# Patient Record
Sex: Male | Born: 1975
Health system: Southern US, Community
[De-identification: ages and names within clinical notes are randomized; demographics above are authoritative.]

## PROBLEM LIST (undated history)

## (undated) DIAGNOSIS — E119 Type 2 diabetes mellitus without complications: Secondary | ICD-10-CM

---

## 2010-03-28 ENCOUNTER — Ambulatory Visit: Payer: Self-pay | Admitting: Family Medicine

## 2014-06-21 ENCOUNTER — Emergency Department: Payer: Self-pay | Admitting: Emergency Medicine

## 2014-09-30 LAB — HM DIABETES EYE EXAM

## 2015-01-06 ENCOUNTER — Other Ambulatory Visit: Payer: Self-pay | Admitting: Family Medicine

## 2015-04-10 ENCOUNTER — Other Ambulatory Visit: Payer: Self-pay | Admitting: Family Medicine

## 2015-04-12 NOTE — Telephone Encounter (Signed)
Patient was last seen in March by Dr. Jeananne Rama; he was supposed to return 3 months later for follow-up Please let Aaron Wood know that we'd like to see patient for an appointment here in the office for:  Diabetes, high cholesterol Please schedule a visit with Dr. Jeananne Rama in the next: few weeks Fasting?  Yes please Thank you, Dr. Lovie Macadamia approve one week to get him through until Dr. Jeananne Rama return to provide further refills; he may want labs before more medicine is approved

## 2015-05-03 ENCOUNTER — Encounter: Payer: Self-pay | Admitting: Family Medicine

## 2015-05-26 ENCOUNTER — Other Ambulatory Visit: Payer: Self-pay | Admitting: Family Medicine

## 2015-05-26 NOTE — Telephone Encounter (Signed)
apt

## 2015-05-26 NOTE — Telephone Encounter (Signed)
Pt has been called twice and a letter was sent 05/03/15 for pt to make appt.

## 2016-01-23 DIAGNOSIS — Z23 Encounter for immunization: Secondary | ICD-10-CM | POA: Diagnosis not present

## 2016-08-07 IMAGING — CT CT ABD-PELV W/O CM
1 of 4 series · 5 of 46 positions shown, 10 images · non-contrast
Comparison: Report from 11/29/2003

CLINICAL DATA: Left flank and left lower quadrant abdominal pain.
Diaphoresis. History of renal calculi. Vomiting.

EXAM:
CT ABDOMEN AND PELVIS WITHOUT CONTRAST
TECHNIQUE: Multidetector CT imaging of the abdomen and pelvis was performed
following the standard protocol without IV contrast.

[Series 4: lung windows · axial · 0.74mm/px · z∈[-128,-28]mm · 5 of 30 slices shown, 10 images]
[im 5/30  soft-tissue]
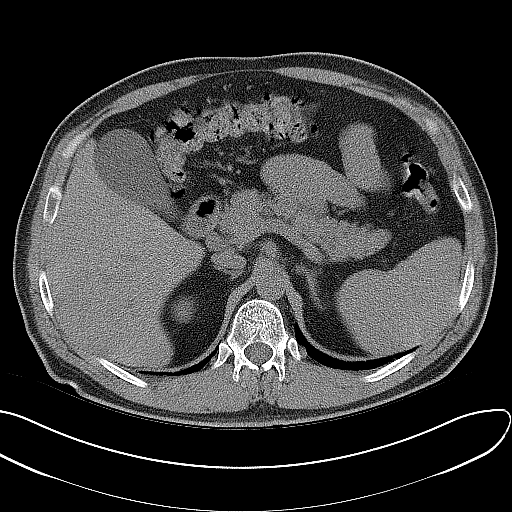
[im 5/30  bone]
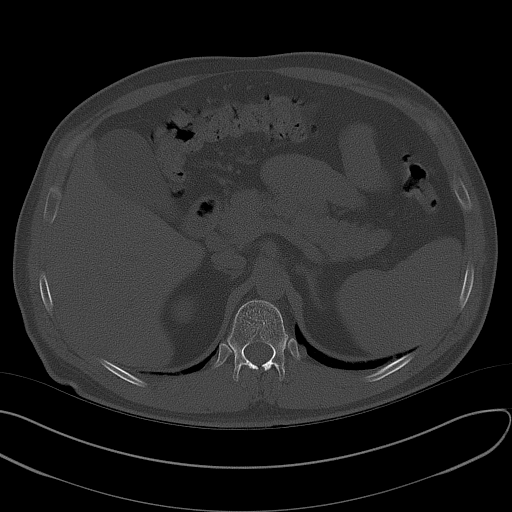
[im 10/30  soft-tissue]
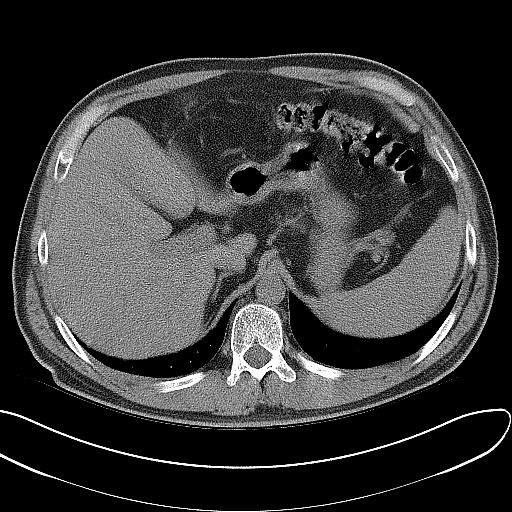
[im 10/30  lung]
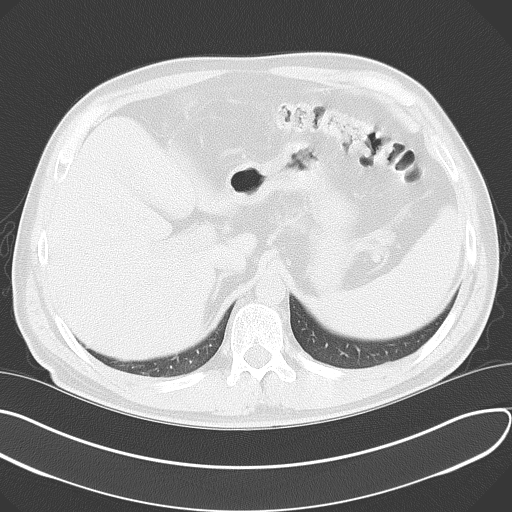
[im 15/30  soft-tissue]
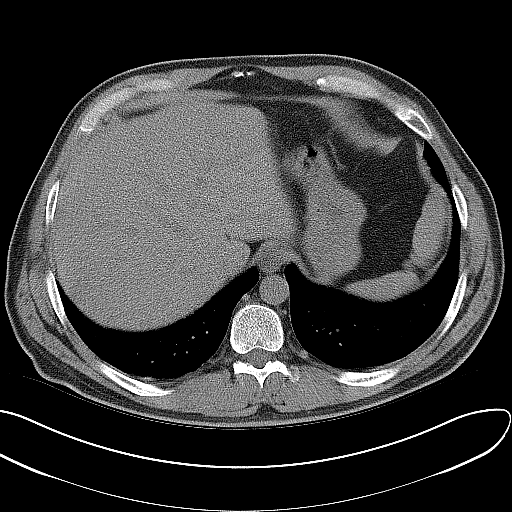
[im 15/30  lung]
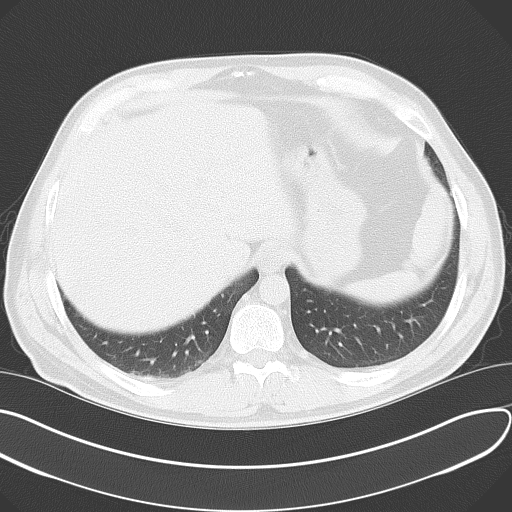
[im 20/30  soft-tissue]
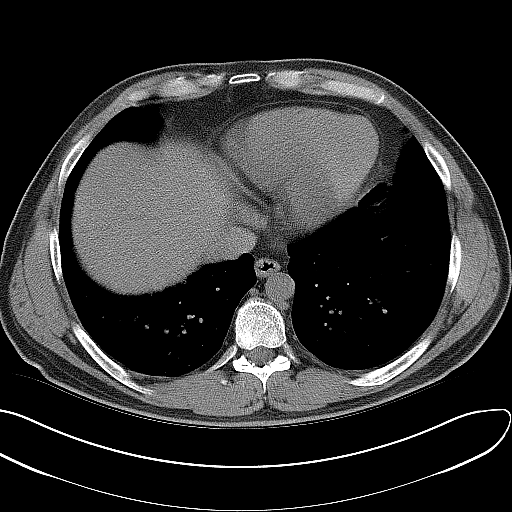
[im 20/30  lung]
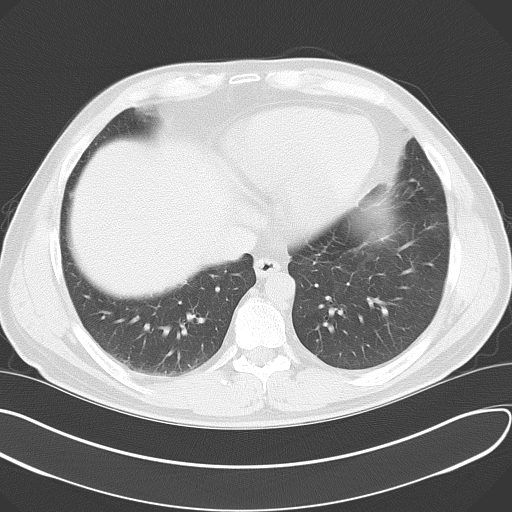
[im 25/30  soft-tissue]
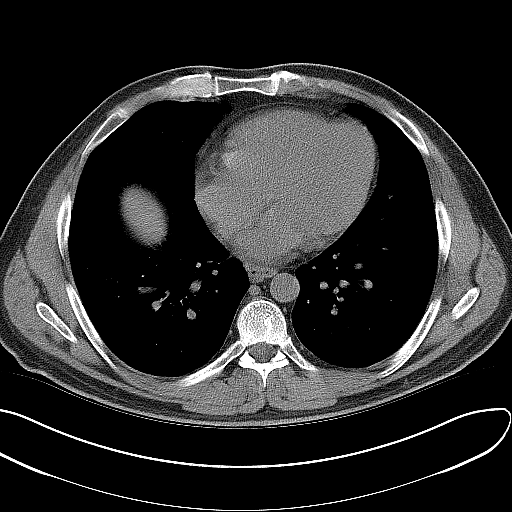
[im 25/30  lung]
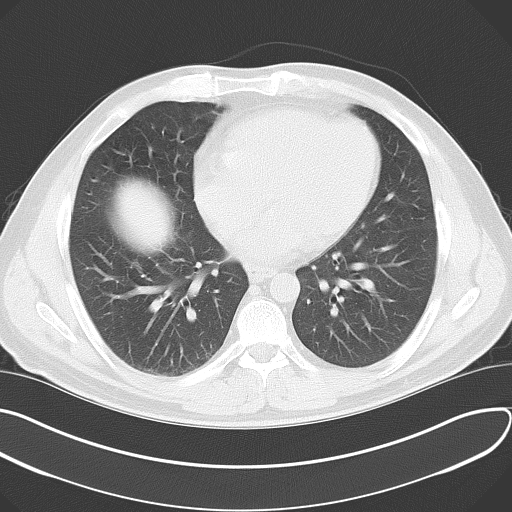

[5 of 46 positions shown; findings below may reference images not displayed]

FINDINGS: Lower chest:  Borderline cardiomegaly.

Hepatobiliary: 1.6 by 0.7 cm hypodense lesion posteriorly in segment
4A of the liver, image 20 series 2.

Pancreas: Unremarkable

Spleen: Unremarkable

Adrenals/Urinary Tract: Asymmetric left perirenal stranding with
asymmetric fullness of the left collecting system and mild left
hydroureter extending down to a 2 mm left UVJ calculus, shown on
image 78 series 5. There is also a 1-2 mm punctate nonobstructive
left mid kidney calculus. Faintly accentuated density along the
renal hypo wall without overt calcific density in this vicinity.

Stomach/Bowel: Unremarkable

Vascular/Lymphatic: Unremarkable

Reproductive: Unremarkable

Other: No supplemental non-categorized findings.

Musculoskeletal: Unremarkable
IMPRESSION: 1. Left UVJ 2 mm calculus associated with mild left hydroureter,
asymmetric left perirenal stranding, and fullness left collecting
system without overt hydronephrosis. There is also a punctate 1-2 mm
nonobstructive left mid kidney calculus.
2. 1.6 by 0.7 cm hypodense lesion posteriorly in segment 4 of the
liver, technically nonspecific although statistically likely to be a
benign lesion such as cyst or hemangioma.
3. Borderline cardiomegaly.

## 2017-05-15 ENCOUNTER — Ambulatory Visit: Payer: Self-pay | Admitting: Family Medicine

## 2017-07-11 ENCOUNTER — Encounter: Payer: Self-pay | Admitting: Family Medicine

## 2017-07-11 ENCOUNTER — Ambulatory Visit (INDEPENDENT_AMBULATORY_CARE_PROVIDER_SITE_OTHER): Payer: BLUE CROSS/BLUE SHIELD | Admitting: Family Medicine

## 2017-07-11 DIAGNOSIS — E785 Hyperlipidemia, unspecified: Secondary | ICD-10-CM

## 2017-07-11 DIAGNOSIS — E119 Type 2 diabetes mellitus without complications: Secondary | ICD-10-CM | POA: Diagnosis not present

## 2017-07-11 DIAGNOSIS — G4733 Obstructive sleep apnea (adult) (pediatric): Secondary | ICD-10-CM

## 2017-07-11 DIAGNOSIS — E1169 Type 2 diabetes mellitus with other specified complication: Secondary | ICD-10-CM | POA: Insufficient documentation

## 2017-07-11 LAB — LP+ALT+AST PICCOLO, WAIVED
ALT (SGPT) PICCOLO, WAIVED: 17 U/L (ref 10–47)
AST (SGOT) Piccolo, Waived: 18 U/L (ref 11–38)
CHOLESTEROL PICCOLO, WAIVED: 222 mg/dL — AB (ref <200)
Chol/HDL Ratio Piccolo,Waive: 4.7 mg/dL
HDL CHOL PICCOLO, WAIVED: 47 mg/dL — AB (ref >59)
LDL CHOL CALC PICCOLO WAIVED: 155 mg/dL — AB (ref <100)
Triglycerides Piccolo,Waived: 97 mg/dL (ref <150)
VLDL Chol Calc Piccolo,Waive: 19 mg/dL (ref <30)

## 2017-07-11 LAB — BAYER DCA HB A1C WAIVED: HB A1C: 12.9 % — AB (ref <7.0)

## 2017-07-11 MED ORDER — METFORMIN HCL 500 MG PO TABS: 1000.0000 mg | ORAL_TABLET | Freq: Two times a day (BID) | ORAL | 1 refills | Status: DC

## 2017-07-11 MED ORDER — ATORVASTATIN CALCIUM 20 MG PO TABS: 20.0000 mg | ORAL_TABLET | Freq: Every day | ORAL | 1 refills | Status: DC

## 2017-07-11 NOTE — Assessment & Plan Note (Signed)
Discussed diabetes poor control patient ready to get started again will restart metformin increasing 500 mg a week until thousand twice daily.

## 2017-07-11 NOTE — Assessment & Plan Note (Signed)
Also ready to get started with care on cholesterol will restart atorvastatin 20 mg

## 2017-07-11 NOTE — Progress Notes (Signed)
   BP 133/85   Pulse 68   Ht 5' 7"  (1.702 m)   Wt 191 lb (86.6 kg)   SpO2 98%   BMI 29.91 kg/m    Subjective:    Patient ID: Aaron Wood, male    DOB: 11-04-1975, 42 y.o.   MRN: [id number]  HPI: Aaron Wood is a 42 y.o. male  Chief Complaint  Patient presents with  . Follow-up  . Insomnia   Patient with observed sleep apnea gasping and choking with other sleep apnea symptoms see sleep scale an Epworth score of 18. This is been ongoing for several years and getting worse.  Patient's wife ready for him to do something.  Also is ready and motivated to get back on medications. When taking medications had no problems.  Relevant past medical, surgical, family and social history reviewed and updated as indicated. Interim medical history since our last visit reviewed. Allergies and medications reviewed and updated.  Review of Systems  Constitutional: Negative.   Respiratory: Negative.   Cardiovascular: Negative.     Per HPI unless specifically indicated above     Objective:    BP 133/85   Pulse 68   Ht 5' 7"  (1.702 m)   Wt 191 lb (86.6 kg)   SpO2 98%   BMI 29.91 kg/m   Wt Readings from Last 3 Encounters:  07/11/17 191 lb (86.6 kg)    Physical Exam  No results found for this or any previous visit.    Assessment & Plan:   Problem List Items Addressed This Visit      Respiratory   OSA (obstructive sleep apnea)    Sleep apnea symptoms refer for sleep study      Relevant Orders   Ambulatory referral to Sleep Studies     Endocrine   DM2 (diabetes mellitus, type 2) ([place])    Discussed diabetes poor control patient ready to get started again will restart metformin increasing 500 mg a week until thousand twice daily.      Relevant Medications   atorvastatin (LIPITOR) 20 MG tablet   metFORMIN (GLUCOPHAGE) 500 MG tablet   Other Relevant Orders   Basic metabolic panel   Bayer DCA Hb A1c Waived     Other   Hyperlipemia    Also ready to get started with  care on cholesterol will restart atorvastatin 20 mg      Relevant Medications   atorvastatin (LIPITOR) 20 MG tablet   Other Relevant Orders   LP+ALT+AST Piccolo, Waived       Follow up plan: Return in about 3 months (around 10/11/2017) for Physical Exam, Hemoglobin A1c.

## 2017-07-11 NOTE — Assessment & Plan Note (Signed)
Sleep apnea symptoms refer for sleep study

## 2017-07-12 ENCOUNTER — Telehealth: Payer: Self-pay | Admitting: Family Medicine

## 2017-07-12 ENCOUNTER — Encounter: Payer: Self-pay | Admitting: Family Medicine

## 2017-07-12 LAB — BASIC METABOLIC PANEL
BUN / CREAT RATIO: 12 (ref 9–20)
BUN: 11 mg/dL (ref 6–24)
CALCIUM: 10.2 mg/dL (ref 8.7–10.2)
CHLORIDE: 100 mmol/L (ref 96–106)
CO2: 24 mmol/L (ref 20–29)
CREATININE: 0.93 mg/dL (ref 0.76–1.27)
GFR calc Af Amer: 116 mL/min/{1.73_m2} (ref >59)
GFR calc non Af Amer: 100 mL/min/{1.73_m2} (ref >59)
Glucose: 206 mg/dL — ABNORMAL HIGH (ref 65–99)
Potassium: 4.7 mmol/L (ref 3.5–5.2)
Sodium: 138 mmol/L (ref 134–144)

## 2017-07-12 NOTE — Telephone Encounter (Unsigned)
Copied from [place] [id number]. Topic: Quick Communication - See Telephone Encounter >> Jul 12, 2017 11:40 AM Neva Seat wrote: [place] [phone]  Needing office visit notes on pt to be faxed over asap. [phone] - fax

## 2017-07-30 ENCOUNTER — Encounter: Payer: Self-pay | Admitting: Family Medicine

## 2017-07-30 DIAGNOSIS — G4733 Obstructive sleep apnea (adult) (pediatric): Secondary | ICD-10-CM | POA: Diagnosis not present

## 2017-08-13 ENCOUNTER — Telehealth: Payer: Self-pay | Admitting: Family Medicine

## 2017-08-13 NOTE — Telephone Encounter (Signed)
Please see prior message from March. Never received that encounter. Notes were faxed today. Patient is aware.

## 2017-08-13 NOTE — Telephone Encounter (Signed)
Copied from [place] [id number]. Topic: Quick Communication - See Telephone Encounter >> Aug 13, 2017  3:03 PM Boyd Kerbs wrote: CRM for notification.   Pt. Called saying Dr. Jeananne Rama had called and told him he would need a machine and he has not heard anything back. Asking what he needs to do   See Telephone encounter for: 08/13/17.

## 2017-08-22 DIAGNOSIS — G4733 Obstructive sleep apnea (adult) (pediatric): Secondary | ICD-10-CM | POA: Diagnosis not present

## 2017-09-22 DIAGNOSIS — G4733 Obstructive sleep apnea (adult) (pediatric): Secondary | ICD-10-CM | POA: Diagnosis not present

## 2017-10-15 ENCOUNTER — Encounter: Payer: Self-pay | Admitting: Family Medicine

## 2017-10-15 ENCOUNTER — Ambulatory Visit (INDEPENDENT_AMBULATORY_CARE_PROVIDER_SITE_OTHER): Payer: BLUE CROSS/BLUE SHIELD | Admitting: Family Medicine

## 2017-10-15 VITALS — BP 129/80 | HR 84 | Ht 66.14 in | Wt 183.0 lb

## 2017-10-15 DIAGNOSIS — Z13220 Encounter for screening for lipoid disorders: Secondary | ICD-10-CM

## 2017-10-15 DIAGNOSIS — Z1329 Encounter for screening for other suspected endocrine disorder: Secondary | ICD-10-CM | POA: Diagnosis not present

## 2017-10-15 DIAGNOSIS — Z23 Encounter for immunization: Secondary | ICD-10-CM

## 2017-10-15 DIAGNOSIS — Z125 Encounter for screening for malignant neoplasm of prostate: Secondary | ICD-10-CM

## 2017-10-15 DIAGNOSIS — Z0001 Encounter for general adult medical examination with abnormal findings: Secondary | ICD-10-CM

## 2017-10-15 DIAGNOSIS — G4733 Obstructive sleep apnea (adult) (pediatric): Secondary | ICD-10-CM

## 2017-10-15 DIAGNOSIS — E119 Type 2 diabetes mellitus without complications: Secondary | ICD-10-CM

## 2017-10-15 DIAGNOSIS — E785 Hyperlipidemia, unspecified: Secondary | ICD-10-CM | POA: Diagnosis not present

## 2017-10-15 DIAGNOSIS — Z0000 Encounter for general adult medical examination without abnormal findings: Secondary | ICD-10-CM

## 2017-10-15 DIAGNOSIS — Z114 Encounter for screening for human immunodeficiency virus [HIV]: Secondary | ICD-10-CM | POA: Diagnosis not present

## 2017-10-15 LAB — URINALYSIS, ROUTINE W REFLEX MICROSCOPIC
Bilirubin, UA: NEGATIVE
Leukocytes, UA: NEGATIVE
NITRITE UA: NEGATIVE
PH UA: 5 (ref 5.0–7.5)
RBC, UA: NEGATIVE
Specific Gravity, UA: >1.03 — ABNORMAL HIGH (ref 1.005–1.030)
UUROB: 0.2 mg/dL (ref 0.2–1.0)

## 2017-10-15 LAB — MICROALBUMIN, URINE WAIVED
CREATININE, URINE WAIVED: 300 mg/dL (ref 10–300)
MICROALB, UR WAIVED: 80 mg/L — AB (ref 0–19)

## 2017-10-15 LAB — MICROSCOPIC EXAMINATION: Bacteria, UA: NONE SEEN

## 2017-10-15 LAB — BAYER DCA HB A1C WAIVED: HB A1C: 9.8 % — AB (ref <7.0)

## 2017-10-15 MED ORDER — METFORMIN HCL 500 MG PO TABS: 1000.0000 mg | ORAL_TABLET | Freq: Two times a day (BID) | ORAL | 4 refills | Status: DC

## 2017-10-15 MED ORDER — ATORVASTATIN CALCIUM 20 MG PO TABS: 20.0000 mg | ORAL_TABLET | Freq: Every day | ORAL | 4 refills | Status: DC

## 2017-10-15 MED ORDER — DAPAGLIFLOZIN PROPANEDIOL 10 MG PO TABS: 10.0000 mg | ORAL_TABLET | Freq: Every day | ORAL | 3 refills | Status: DC

## 2017-10-15 NOTE — Assessment & Plan Note (Signed)
Discussed diabetes poor control patient will continue metformin add Wilder Glade gave coupon

## 2017-10-15 NOTE — Assessment & Plan Note (Signed)
The current medical regimen is effective;  continue present plan and medications.  

## 2017-10-15 NOTE — Progress Notes (Signed)
BP 129/80   Pulse 84   Ht 5' 6.14" (1.68 m)   Wt 183 lb (83 kg)   SpO2 98%   BMI 29.41 kg/m    Subjective:    Patient ID: Aaron Wood, male    DOB: [birth year], 42 y.o.   MRN: [id number]  HPI: Aaron Wood is a 42 y.o. male  Annual exam All in all doing well taking metformin faithfully blood sugars though consistently around 200 or so in the morning when waking up. Also taking atorvastatin without problems.  Relevant past medical, surgical, family and social history reviewed and updated as indicated. Interim medical history since our last visit reviewed. Allergies and medications reviewed and updated.  Review of Systems  Constitutional: Negative.   HENT: Negative.   Eyes: Negative.   Respiratory: Negative.   Cardiovascular: Negative.   Gastrointestinal: Negative.   Endocrine: Negative.   Genitourinary: Negative.   Musculoskeletal: Negative.   Skin: Negative.   Allergic/Immunologic: Negative.   Neurological: Negative.   Hematological: Negative.   Psychiatric/Behavioral: Negative.     Per HPI unless specifically indicated above     Objective:    BP 129/80   Pulse 84   Ht 5' 6.14" (1.68 m)   Wt 183 lb (83 kg)   SpO2 98%   BMI 29.41 kg/m   Wt Readings from Last 3 Encounters:  10/15/17 183 lb (83 kg)  07/11/17 191 lb (86.6 kg)    Physical Exam  Constitutional: He is oriented to person, place, and time. He appears well-developed and well-nourished.  HENT:  Head: Normocephalic and atraumatic.  Right Ear: External ear normal.  Left Ear: External ear normal.  Eyes: Pupils are equal, round, and reactive to light. Conjunctivae and EOM are normal.  Neck: Normal range of motion. Neck supple.  Cardiovascular: Normal rate, regular rhythm, normal heart sounds and intact distal pulses.  Pulmonary/Chest: Effort normal and breath sounds normal.  Abdominal: Soft. Bowel sounds are normal. There is no splenomegaly or hepatomegaly.  Genitourinary: Rectum normal,  prostate normal and penis normal.  Musculoskeletal: Normal range of motion.  Neurological: He is alert and oriented to person, place, and time. He has normal reflexes.  Skin: No rash noted. No erythema.  Psychiatric: He has a normal mood and affect. His behavior is normal. Judgment and thought content normal.    Results for orders placed or performed in visit on 02/58/52  Basic metabolic panel  Result Value Ref Range   Glucose 206 (H) 65 - 99 mg/dL   BUN 11 6 - 24 mg/dL   Creatinine, Ser 0.93 0.76 - 1.27 mg/dL   GFR calc non Af Amer 100 >59 mL/min/1.73   GFR calc Af Amer 116 >59 mL/min/1.73   BUN/Creatinine Ratio 12 9 - 20   Sodium 138 134 - 144 mmol/L   Potassium 4.7 3.5 - 5.2 mmol/L   Chloride 100 96 - 106 mmol/L   CO2 24 20 - 29 mmol/L   Calcium 10.2 8.7 - 10.2 mg/dL  Bayer DCA Hb A1c Waived  Result Value Ref Range   HB A1C (BAYER DCA - WAIVED) 12.9 (H) <7.0 %  LP+ALT+AST Piccolo, Waived  Result Value Ref Range   ALT (SGPT) Piccolo, Waived 17 10 - 47 U/L   AST (SGOT) Piccolo, Waived 18 11 - 38 U/L   Cholesterol Piccolo, Waived 222 (H) <200 mg/dL   HDL Chol Piccolo, Waived 47 (L) >59 mg/dL   Triglycerides Piccolo,Waived 97 <150 mg/dL   Chol/HDL Ratio  Piccolo,Waive 4.7 mg/dL   LDL Chol Calc Piccolo Waived 155 (H) <100 mg/dL   VLDL Chol Calc Piccolo,Waive 19 <30 mg/dL      Assessment & Plan:   Problem List Items Addressed This Visit      Respiratory   OSA (obstructive sleep apnea)    The current medical regimen is effective;  continue present plan and medications.         Endocrine   DM2 (diabetes mellitus, type 2) ([place]) - Primary    Discussed diabetes poor control patient will continue metformin add Wilder Glade gave coupon      Relevant Medications   atorvastatin (LIPITOR) 20 MG tablet   metFORMIN (GLUCOPHAGE) 500 MG tablet   dapagliflozin propanediol (FARXIGA) 10 MG TABS tablet   Other Relevant Orders   CBC with Differential/Platelet   Comprehensive metabolic  panel   Lipid panel   Urinalysis, Routine w reflex microscopic   Bayer DCA Hb A1c Waived   Microalbumin, Urine Waived     Other   Hyperlipemia    The current medical regimen is effective;  continue present plan and medications.       Relevant Medications   atorvastatin (LIPITOR) 20 MG tablet   Other Relevant Orders   CBC with Differential/Platelet   Comprehensive metabolic panel   Lipid panel   Urinalysis, Routine w reflex microscopic   Bayer DCA Hb A1c Waived   Microalbumin, Urine Waived    Other Visit Diagnoses    Annual physical exam       Need for Tdap vaccination       Encounter for screening for HIV       Relevant Orders   HIV antibody (with reflex)   Screening cholesterol level       Prostate cancer screening       Relevant Orders   PSA   Thyroid disorder screen       Relevant Orders   TSH       Follow up plan: Return in about 3 months (around 01/15/2018) for Hemoglobin A1c.

## 2017-10-16 ENCOUNTER — Encounter: Payer: Self-pay | Admitting: Family Medicine

## 2017-10-16 LAB — CBC WITH DIFFERENTIAL/PLATELET
BASOS ABS: 0 10*3/uL (ref 0.0–0.2)
Basos: 0 %
EOS (ABSOLUTE): 0.2 10*3/uL (ref 0.0–0.4)
Eos: 3 %
Hematocrit: 42.7 % (ref 37.5–51.0)
Hemoglobin: 14.9 g/dL (ref 13.0–17.7)
IMMATURE GRANS (ABS): 0 10*3/uL (ref 0.0–0.1)
Immature Granulocytes: 0 %
LYMPHS: 22 %
Lymphocytes Absolute: 1.7 10*3/uL (ref 0.7–3.1)
MCH: 30 pg (ref 26.6–33.0)
MCHC: 34.9 g/dL (ref 31.5–35.7)
MCV: 86 fL (ref 79–97)
Monocytes Absolute: 0.5 10*3/uL (ref 0.1–0.9)
Monocytes: 7 %
NEUTROS ABS: 5.1 10*3/uL (ref 1.4–7.0)
Neutrophils: 68 %
PLATELETS: 275 10*3/uL (ref 150–450)
RBC: 4.96 x10E6/uL (ref 4.14–5.80)
RDW: 13.7 % (ref 12.3–15.4)
WBC: 7.5 10*3/uL (ref 3.4–10.8)

## 2017-10-16 LAB — COMPREHENSIVE METABOLIC PANEL
ALK PHOS: 61 IU/L (ref 39–117)
ALT: 11 IU/L (ref 0–44)
AST: 11 IU/L (ref 0–40)
Albumin/Globulin Ratio: 2.3 — ABNORMAL HIGH (ref 1.2–2.2)
Albumin: 5 g/dL (ref 3.5–5.5)
BILIRUBIN TOTAL: 0.4 mg/dL (ref 0.0–1.2)
BUN/Creatinine Ratio: 14 (ref 9–20)
BUN: 15 mg/dL (ref 6–24)
CHLORIDE: 101 mmol/L (ref 96–106)
CO2: 21 mmol/L (ref 20–29)
Calcium: 10.1 mg/dL (ref 8.7–10.2)
Creatinine, Ser: 1.05 mg/dL (ref 0.76–1.27)
GFR calc non Af Amer: 87 mL/min/{1.73_m2} (ref >59)
GFR, EST AFRICAN AMERICAN: 100 mL/min/{1.73_m2} (ref >59)
GLUCOSE: 162 mg/dL — AB (ref 65–99)
Globulin, Total: 2.2 g/dL (ref 1.5–4.5)
Potassium: 4.4 mmol/L (ref 3.5–5.2)
Sodium: 140 mmol/L (ref 134–144)
TOTAL PROTEIN: 7.2 g/dL (ref 6.0–8.5)

## 2017-10-16 LAB — HIV ANTIBODY (ROUTINE TESTING W REFLEX): HIV Screen 4th Generation wRfx: NONREACTIVE

## 2017-10-16 LAB — LIPID PANEL
CHOLESTEROL TOTAL: 146 mg/dL (ref 100–199)
Chol/HDL Ratio: 3.7 ratio (ref 0.0–5.0)
HDL: 40 mg/dL (ref >39)
LDL Calculated: 88 mg/dL (ref 0–99)
TRIGLYCERIDES: 92 mg/dL (ref 0–149)
VLDL CHOLESTEROL CAL: 18 mg/dL (ref 5–40)

## 2017-10-16 LAB — TSH: TSH: 1.32 u[IU]/mL (ref 0.450–4.500)

## 2017-10-16 LAB — PSA: Prostate Specific Ag, Serum: 0.6 ng/mL (ref 0.0–4.0)

## 2017-10-22 DIAGNOSIS — G4733 Obstructive sleep apnea (adult) (pediatric): Secondary | ICD-10-CM | POA: Diagnosis not present

## 2017-11-22 DIAGNOSIS — G4733 Obstructive sleep apnea (adult) (pediatric): Secondary | ICD-10-CM | POA: Diagnosis not present

## 2017-12-23 DIAGNOSIS — G4733 Obstructive sleep apnea (adult) (pediatric): Secondary | ICD-10-CM | POA: Diagnosis not present

## 2018-01-22 DIAGNOSIS — G4733 Obstructive sleep apnea (adult) (pediatric): Secondary | ICD-10-CM | POA: Diagnosis not present

## 2018-01-28 DIAGNOSIS — Z23 Encounter for immunization: Secondary | ICD-10-CM | POA: Diagnosis not present

## 2018-02-03 ENCOUNTER — Ambulatory Visit: Payer: BLUE CROSS/BLUE SHIELD | Admitting: Family Medicine

## 2018-02-05 ENCOUNTER — Other Ambulatory Visit: Payer: Self-pay | Admitting: Family Medicine

## 2018-02-05 NOTE — Telephone Encounter (Signed)
Requested Prescriptions  Pending Prescriptions Disp Refills  . FARXIGA 10 MG TABS tablet [Pharmacy Med Name: FARXIGA 10 MG TABLET] 90 tablet 0    Sig: TAKE 1 TABLET BY MOUTH EVERY DAY     Endocrinology:  Diabetes - SGLT2 Inhibitors Failed - 02/05/2018  1:47 AM      Failed - HBA1C is between 0 and 7.9 and within 180 days    No results found for: HGBA1C       Passed - Cr in normal range and within 360 days    Creatinine, Ser  Date Value Ref Range Status  10/15/2017 1.05 0.76 - 1.27 mg/dL Final         Passed - LDL in normal range and within 360 days    LDL Calculated  Date Value Ref Range Status  10/15/2017 88 0 - 99 mg/dL Final         Passed - eGFR in normal range and within 360 days    GFR calc Af Amer  Date Value Ref Range Status  10/15/2017 100 >59 mL/min/1.73 Final   GFR calc non Af Amer  Date Value Ref Range Status  10/15/2017 87 >59 mL/min/1.73 Final         Passed - Valid encounter within last 6 months    Recent Outpatient Visits          3 months ago Type 2 diabetes mellitus without complication, without long-term current use of insulin ([place])   [place] Crissman, Mark A, MD   6 months ago Type 2 diabetes mellitus without complication, without long-term current use of insulin ([place])   [place] Crissman, Jeannette How, MD      Future Appointments            In 2 weeks Crissman, Jeannette How, MD [facility]

## 2018-02-20 ENCOUNTER — Encounter: Payer: Self-pay | Admitting: Family Medicine

## 2018-02-20 ENCOUNTER — Ambulatory Visit (INDEPENDENT_AMBULATORY_CARE_PROVIDER_SITE_OTHER): Payer: BLUE CROSS/BLUE SHIELD | Admitting: Family Medicine

## 2018-02-20 VITALS — BP 124/81 | HR 88 | Temp 97.8°F | Ht 66.14 in | Wt 179.4 lb

## 2018-02-20 DIAGNOSIS — E785 Hyperlipidemia, unspecified: Secondary | ICD-10-CM | POA: Diagnosis not present

## 2018-02-20 DIAGNOSIS — G4733 Obstructive sleep apnea (adult) (pediatric): Secondary | ICD-10-CM | POA: Diagnosis not present

## 2018-02-20 DIAGNOSIS — E119 Type 2 diabetes mellitus without complications: Secondary | ICD-10-CM

## 2018-02-20 MED ORDER — SITAGLIPTIN PHOSPHATE 100 MG PO TABS: 100.0000 mg | ORAL_TABLET | Freq: Every day | ORAL | 4 refills | Status: DC

## 2018-02-20 NOTE — Assessment & Plan Note (Signed)
The current medical regimen is effective;  continue present plan and medications.  

## 2018-02-20 NOTE — Progress Notes (Signed)
BP 124/81 (BP Location: Left Arm, Patient Position: Sitting, Cuff Size: Normal)   Pulse 88   Temp 97.8 F (36.6 C)   Ht 5' 6.14" (1.68 m)   Wt 179 lb 6 oz (81.4 kg)   SpO2 100%   BMI 28.83 kg/m    Subjective:    Patient ID: Aaron Wood, male    DOB: [birth year], 42 y.o.   MRN: [id number]  HPI: Aaron Wood is a 42 y.o. male  Chief Complaint  Patient presents with  . Diabetes  . Hyperlipidemia   Patient follow-up all in all doing well diabetes no low blood sugar spells has continued to do great with weight loss through better exercise and lifestyle. No low blood sugar spells. Taking metformin and for [employer] daily without problems. Also taking atorvastatin 20 mg once a day without problems.  Relevant past medical, surgical, family and social history reviewed and updated as indicated. Interim medical history since our last visit reviewed. Allergies and medications reviewed and updated.  Review of Systems  Constitutional: Negative.   Respiratory: Negative.   Cardiovascular: Negative.     Per HPI unless specifically indicated above     Objective:    BP 124/81 (BP Location: Left Arm, Patient Position: Sitting, Cuff Size: Normal)   Pulse 88   Temp 97.8 F (36.6 C)   Ht 5' 6.14" (1.68 m)   Wt 179 lb 6 oz (81.4 kg)   SpO2 100%   BMI 28.83 kg/m   Wt Readings from Last 3 Encounters:  02/20/18 179 lb 6 oz (81.4 kg)  10/15/17 183 lb (83 kg)  07/11/17 191 lb (86.6 kg)    Physical Exam  Constitutional: He is oriented to person, place, and time. He appears well-developed and well-nourished.  HENT:  Head: Normocephalic and atraumatic.  Eyes: Conjunctivae and EOM are normal.  Neck: Normal range of motion.  Cardiovascular: Normal rate, regular rhythm and normal heart sounds.  Pulmonary/Chest: Effort normal and breath sounds normal.  Musculoskeletal: Normal range of motion.  Neurological: He is alert and oriented to person, place, and time.  Skin: No erythema.    Psychiatric: He has a normal mood and affect. His behavior is normal. Judgment and thought content normal.    Results for orders placed or performed in visit on 10/15/17  Microscopic Examination  Result Value Ref Range   WBC, UA 0-5 0 - 5 /hpf   RBC, UA 0-2 0 - 2 /hpf   Epithelial Cells (non renal) 0-10 0 - 10 /hpf   Bacteria, UA None seen None seen/Few  CBC with Differential/Platelet  Result Value Ref Range   WBC 7.5 3.4 - 10.8 x10E3/uL   RBC 4.96 4.14 - 5.80 x10E6/uL   Hemoglobin 14.9 13.0 - 17.7 g/dL   Hematocrit 42.7 37.5 - 51.0 %   MCV 86 79 - 97 fL   MCH 30.0 26.6 - 33.0 pg   MCHC 34.9 31.5 - 35.7 g/dL   RDW 13.7 12.3 - 15.4 %   Platelets 275 150 - 450 x10E3/uL   Neutrophils 68 Not Estab. %   Lymphs 22 Not Estab. %   Monocytes 7 Not Estab. %   Eos 3 Not Estab. %   Basos 0 Not Estab. %   Neutrophils Absolute 5.1 1.4 - 7.0 x10E3/uL   Lymphocytes Absolute 1.7 0.7 - 3.1 x10E3/uL   Monocytes Absolute 0.5 0.1 - 0.9 x10E3/uL   EOS (ABSOLUTE) 0.2 0.0 - 0.4 x10E3/uL   Basophils Absolute 0.0 0.0 -  0.2 x10E3/uL   Immature Granulocytes 0 Not Estab. %   Immature Grans (Abs) 0.0 0.0 - 0.1 x10E3/uL  Comprehensive metabolic panel  Result Value Ref Range   Glucose 162 (H) 65 - 99 mg/dL   BUN 15 6 - 24 mg/dL   Creatinine, Ser 1.05 0.76 - 1.27 mg/dL   GFR calc non Af Amer 87 >59 mL/min/1.73   GFR calc Af Amer 100 >59 mL/min/1.73   BUN/Creatinine Ratio 14 9 - 20   Sodium 140 134 - 144 mmol/L   Potassium 4.4 3.5 - 5.2 mmol/L   Chloride 101 96 - 106 mmol/L   CO2 21 20 - 29 mmol/L   Calcium 10.1 8.7 - 10.2 mg/dL   Total Protein 7.2 6.0 - 8.5 g/dL   Albumin 5.0 3.5 - 5.5 g/dL   Globulin, Total 2.2 1.5 - 4.5 g/dL   Albumin/Globulin Ratio 2.3 (H) 1.2 - 2.2   Bilirubin Total 0.4 0.0 - 1.2 mg/dL   Alkaline Phosphatase 61 39 - 117 IU/L   AST 11 0 - 40 IU/L   ALT 11 0 - 44 IU/L  Lipid panel  Result Value Ref Range   Cholesterol, Total 146 100 - 199 mg/dL   Triglycerides 92 0 - 149  mg/dL   HDL 40 >39 mg/dL   VLDL Cholesterol Cal 18 5 - 40 mg/dL   LDL Calculated 88 0 - 99 mg/dL   Chol/HDL Ratio 3.7 0.0 - 5.0 ratio  PSA  Result Value Ref Range   Prostate Specific Ag, Serum 0.6 0.0 - 4.0 ng/mL  TSH  Result Value Ref Range   TSH 1.320 0.450 - 4.500 uIU/mL  Urinalysis, Routine w reflex microscopic  Result Value Ref Range   Specific Gravity, UA >1.030 (H) 1.005 - 1.030   pH, UA 5.0 5.0 - 7.5   Color, UA Yellow Yellow   Appearance Ur Clear Clear   Leukocytes, UA Negative Negative   Protein, UA 1+ (A) Negative/Trace   Glucose, UA 3+ (A) Negative   Ketones, UA 1+ (A) Negative   RBC, UA Negative Negative   Bilirubin, UA Negative Negative   Urobilinogen, Ur 0.2 0.2 - 1.0 mg/dL   Nitrite, UA Negative Negative   Microscopic Examination See below:   Bayer DCA Hb A1c Waived  Result Value Ref Range   HB A1C (BAYER DCA - WAIVED) 9.8 (H) <7.0 %  HIV antibody (with reflex)  Result Value Ref Range   HIV Screen 4th Generation wRfx Non Reactive Non Reactive  Microalbumin, Urine Waived  Result Value Ref Range   Microalb, Ur Waived 80 (H) 0 - 19 mg/L   Creatinine, Urine Waived 300 10 - 300 mg/dL   Microalb/Creat Ratio 30-300 (H) <30 mg/g      Assessment & Plan:   Problem List Items Addressed This Visit      Respiratory   OSA (obstructive sleep apnea)    The current medical regimen is effective;  continue present plan and medications.         Endocrine   DM2 (diabetes mellitus, type 2) ([place]) - Primary    Discussed diabetes significantly improved control with still want to get below 7.  Will add Januvia to patient's regimen      Relevant Medications   sitaGLIPtin (JANUVIA) 100 MG tablet   Other Relevant Orders   Bayer DCA Hb A1c Waived     Other   Hyperlipemia    The current medical regimen is effective;  continue present plan and  medications.           Follow up plan: Return in about 3 months (around 05/23/2018) for Hemoglobin A1c, BMP,  Lipids, ALT,  AST.

## 2018-02-20 NOTE — Assessment & Plan Note (Signed)
Discussed diabetes significantly improved control with still want to get below 7.  Will add Januvia to patient's regimen

## 2018-02-24 DIAGNOSIS — G4733 Obstructive sleep apnea (adult) (pediatric): Secondary | ICD-10-CM | POA: Diagnosis not present

## 2018-03-26 DIAGNOSIS — G4733 Obstructive sleep apnea (adult) (pediatric): Secondary | ICD-10-CM | POA: Diagnosis not present

## 2018-04-26 DIAGNOSIS — G4733 Obstructive sleep apnea (adult) (pediatric): Secondary | ICD-10-CM | POA: Diagnosis not present

## 2018-05-06 ENCOUNTER — Other Ambulatory Visit: Payer: Self-pay | Admitting: Family Medicine

## 2018-05-20 ENCOUNTER — Other Ambulatory Visit: Payer: Self-pay | Admitting: Family Medicine

## 2018-05-20 NOTE — Telephone Encounter (Signed)
Requested Prescriptions  Pending Prescriptions Disp Refills  . JANUVIA 100 MG tablet [Pharmacy Med Name: JANUVIA 100 MG TABLET] 90 tablet 0    Sig: TAKE 1 TABLET BY MOUTH EVERY DAY     Endocrinology:  Diabetes - DPP-4 Inhibitors Failed - 05/20/2018  2:07 AM      Failed - HBA1C is between 0 and 7.9 and within 180 days    HB A1C (BAYER DCA - WAIVED)  Date Value Ref Range Status  10/15/2017 9.8 (H) <7.0 % Final    Comment:                                          Diabetic Adult            <7.0                                       Healthy Adult        4.3 - 5.7                                                           (DCCT/NGSP) American Diabetes Association's Summary of Glycemic Recommendations for Adults with Diabetes: Hemoglobin A1c <7.0%. More stringent glycemic goals (A1c <6.0%) may further reduce complications at the cost of increased risk of hypoglycemia.          Passed - Cr in normal range and within 360 days    Creatinine, Ser  Date Value Ref Range Status  10/15/2017 1.05 0.76 - 1.27 mg/dL Final         Passed - Valid encounter within last 6 months    Recent Outpatient Visits          2 months ago Type 2 diabetes mellitus without complication, without long-term current use of insulin ([place])   [facility] Crissman, Mark A, MD   7 months ago Type 2 diabetes mellitus without complication, without long-term current use of insulin ([place])   [facility] Crissman, Mark A, MD   10 months ago Type 2 diabetes mellitus without complication, without long-term current use of insulin ([place])   [place] Crissman, Jeannette How, MD      Future Appointments            In 6 days Crissman, Jeannette How, MD [facility], [facility]

## 2018-05-26 ENCOUNTER — Encounter: Payer: Self-pay | Admitting: Family Medicine

## 2018-05-26 ENCOUNTER — Ambulatory Visit: Payer: BLUE CROSS/BLUE SHIELD | Admitting: Family Medicine

## 2018-05-26 VITALS — BP 130/85 | HR 84 | Ht 66.14 in | Wt 183.0 lb

## 2018-05-26 DIAGNOSIS — E119 Type 2 diabetes mellitus without complications: Secondary | ICD-10-CM | POA: Diagnosis not present

## 2018-05-26 DIAGNOSIS — G4733 Obstructive sleep apnea (adult) (pediatric): Secondary | ICD-10-CM | POA: Diagnosis not present

## 2018-05-26 DIAGNOSIS — E785 Hyperlipidemia, unspecified: Secondary | ICD-10-CM

## 2018-05-26 LAB — LP+ALT+AST PICCOLO, WAIVED
ALT (SGPT) Piccolo, Waived: 16 U/L (ref 10–47)
AST (SGOT) Piccolo, Waived: 20 U/L (ref 11–38)
CHOL/HDL RATIO PICCOLO,WAIVE: 3.1 mg/dL
Cholesterol Piccolo, Waived: 159 mg/dL (ref <200)
HDL Chol Piccolo, Waived: 51 mg/dL — ABNORMAL LOW (ref >59)
LDL CHOL CALC PICCOLO WAIVED: 92 mg/dL (ref <100)
TRIGLYCERIDES PICCOLO,WAIVED: 82 mg/dL (ref <150)
VLDL Chol Calc Piccolo,Waive: 16 mg/dL (ref <30)

## 2018-05-26 LAB — BAYER DCA HB A1C WAIVED: HB A1C (BAYER DCA - WAIVED): 8 % — ABNORMAL HIGH (ref <7.0)

## 2018-05-26 MED ORDER — SITAGLIPTIN PHOSPHATE 100 MG PO TABS: 100.0000 mg | ORAL_TABLET | Freq: Every day | ORAL | 2 refills | Status: DC

## 2018-05-26 MED ORDER — DAPAGLIFLOZIN PROPANEDIOL 10 MG PO TABS: 10.0000 mg | ORAL_TABLET | Freq: Every day | ORAL | 2 refills | Status: DC

## 2018-05-26 NOTE — Progress Notes (Signed)
BP 130/85   Pulse 84   Ht 5' 6.14" (1.68 m)   Wt 183 lb (83 kg)   SpO2 97%   BMI 29.41 kg/m    Subjective:    Patient ID: Aaron Wood, male    DOB: [birth year], 43 y.o.   MRN: [id number]  HPI: Aaron Wood is a 43 y.o. male  Chief Complaint  Patient presents with  . Follow-up  . Hypertension  . Hyperlipidemia  . Diabetes  . Health Maintenance    A1C - done, Monofilament laid out for provider, Eye exam pt will set up.   Patient with no complaints feels that he is doing better.  Blood pressure doing well with no complaints. Cholesterol seems to be doing well without complaints. Taking diabetes medicines without problems no low blood sugar spells.  Relevant past medical, surgical, family and social history reviewed and updated as indicated. Interim medical history since our last visit reviewed. Allergies and medications reviewed and updated.  Review of Systems  Constitutional: Negative.   Respiratory: Negative.   Cardiovascular: Negative.     Per HPI unless specifically indicated above     Objective:    BP 130/85   Pulse 84   Ht 5' 6.14" (1.68 m)   Wt 183 lb (83 kg)   SpO2 97%   BMI 29.41 kg/m   Wt Readings from Last 3 Encounters:  05/26/18 183 lb (83 kg)  02/20/18 179 lb 6 oz (81.4 kg)  10/15/17 183 lb (83 kg)    Physical Exam Constitutional:      Appearance: He is well-developed.  HENT:     Head: Normocephalic and atraumatic.  Eyes:     Conjunctiva/sclera: Conjunctivae normal.  Neck:     Musculoskeletal: Normal range of motion.  Cardiovascular:     Rate and Rhythm: Normal rate and regular rhythm.     Heart sounds: Normal heart sounds.  Pulmonary:     Effort: Pulmonary effort is normal.     Breath sounds: Normal breath sounds.  Musculoskeletal: Normal range of motion.  Skin:    Findings: No erythema.  Neurological:     Mental Status: He is alert and oriented to person, place, and time.  Psychiatric:        Behavior: Behavior normal.          Thought Content: Thought content normal.        Judgment: Judgment normal.     Results for orders placed or performed in visit on 10/15/17  Microscopic Examination  Result Value Ref Range   WBC, UA 0-5 0 - 5 /hpf   RBC, UA 0-2 0 - 2 /hpf   Epithelial Cells (non renal) 0-10 0 - 10 /hpf   Bacteria, UA None seen None seen/Few  CBC with Differential/Platelet  Result Value Ref Range   WBC 7.5 3.4 - 10.8 x10E3/uL   RBC 4.96 4.14 - 5.80 x10E6/uL   Hemoglobin 14.9 13.0 - 17.7 g/dL   Hematocrit 42.7 37.5 - 51.0 %   MCV 86 79 - 97 fL   MCH 30.0 26.6 - 33.0 pg   MCHC 34.9 31.5 - 35.7 g/dL   RDW 13.7 12.3 - 15.4 %   Platelets 275 150 - 450 x10E3/uL   Neutrophils 68 Not Estab. %   Lymphs 22 Not Estab. %   Monocytes 7 Not Estab. %   Eos 3 Not Estab. %   Basos 0 Not Estab. %   Neutrophils Absolute 5.1 1.4 - 7.0 x10E3/uL  Lymphocytes Absolute 1.7 0.7 - 3.1 x10E3/uL   Monocytes Absolute 0.5 0.1 - 0.9 x10E3/uL   EOS (ABSOLUTE) 0.2 0.0 - 0.4 x10E3/uL   Basophils Absolute 0.0 0.0 - 0.2 x10E3/uL   Immature Granulocytes 0 Not Estab. %   Immature Grans (Abs) 0.0 0.0 - 0.1 x10E3/uL  Comprehensive metabolic panel  Result Value Ref Range   Glucose 162 (H) 65 - 99 mg/dL   BUN 15 6 - 24 mg/dL   Creatinine, Ser 1.05 0.76 - 1.27 mg/dL   GFR calc non Af Amer 87 >59 mL/min/1.73   GFR calc Af Amer 100 >59 mL/min/1.73   BUN/Creatinine Ratio 14 9 - 20   Sodium 140 134 - 144 mmol/L   Potassium 4.4 3.5 - 5.2 mmol/L   Chloride 101 96 - 106 mmol/L   CO2 21 20 - 29 mmol/L   Calcium 10.1 8.7 - 10.2 mg/dL   Total Protein 7.2 6.0 - 8.5 g/dL   Albumin 5.0 3.5 - 5.5 g/dL   Globulin, Total 2.2 1.5 - 4.5 g/dL   Albumin/Globulin Ratio 2.3 (H) 1.2 - 2.2   Bilirubin Total 0.4 0.0 - 1.2 mg/dL   Alkaline Phosphatase 61 39 - 117 IU/L   AST 11 0 - 40 IU/L   ALT 11 0 - 44 IU/L  Lipid panel  Result Value Ref Range   Cholesterol, Total 146 100 - 199 mg/dL   Triglycerides 92 0 - 149 mg/dL   HDL 40 >39 mg/dL    VLDL Cholesterol Cal 18 5 - 40 mg/dL   LDL Calculated 88 0 - 99 mg/dL   Chol/HDL Ratio 3.7 0.0 - 5.0 ratio  PSA  Result Value Ref Range   Prostate Specific Ag, Serum 0.6 0.0 - 4.0 ng/mL  TSH  Result Value Ref Range   TSH 1.320 0.450 - 4.500 uIU/mL  Urinalysis, Routine w reflex microscopic  Result Value Ref Range   Specific Gravity, UA >1.030 (H) 1.005 - 1.030   pH, UA 5.0 5.0 - 7.5   Color, UA Yellow Yellow   Appearance Ur Clear Clear   Leukocytes, UA Negative Negative   Protein, UA 1+ (A) Negative/Trace   Glucose, UA 3+ (A) Negative   Ketones, UA 1+ (A) Negative   RBC, UA Negative Negative   Bilirubin, UA Negative Negative   Urobilinogen, Ur 0.2 0.2 - 1.0 mg/dL   Nitrite, UA Negative Negative   Microscopic Examination See below:   Bayer DCA Hb A1c Waived  Result Value Ref Range   HB A1C (BAYER DCA - WAIVED) 9.8 (H) <7.0 %  HIV antibody (with reflex)  Result Value Ref Range   HIV Screen 4th Generation wRfx Non Reactive Non Reactive  Microalbumin, Urine Waived  Result Value Ref Range   Microalb, Ur Waived 80 (H) 0 - 19 mg/L   Creatinine, Urine Waived 300 10 - 300 mg/dL   Microalb/Creat Ratio 30-300 (H) <30 mg/g      Assessment & Plan:   Problem List Items Addressed This Visit      Respiratory   OSA (obstructive sleep apnea)    The current medical regimen is effective;  continue present plan and medications.         Endocrine   DM2 (diabetes mellitus, type 2) ([place]) - Primary    Showing improvement will continue current medication patient will get rest of control with better diet.      Relevant Medications   dapagliflozin propanediol (FARXIGA) 10 MG TABS tablet   sitaGLIPtin (JANUVIA)  100 MG tablet   Other Relevant Orders   Bayer DCA Hb A1c Waived   LP+ALT+AST Piccolo, Waived   Basic metabolic panel     Other   Hyperlipemia    The current medical regimen is effective;  continue present plan and medications.       Relevant Orders   Bayer DCA Hb A1c  Waived   LP+ALT+AST Piccolo, Waived   Basic metabolic panel       Follow up plan: Return in about 6 months (around 11/24/2018) for Physical Exam, Hemoglobin A1c.

## 2018-05-26 NOTE — Assessment & Plan Note (Signed)
The current medical regimen is effective;  continue present plan and medications.  

## 2018-05-26 NOTE — Assessment & Plan Note (Signed)
Showing improvement will continue current medication patient will get rest of control with better diet.

## 2018-05-27 ENCOUNTER — Encounter: Payer: Self-pay | Admitting: Family Medicine

## 2018-05-27 LAB — BASIC METABOLIC PANEL
BUN/Creatinine Ratio: 12 (ref 9–20)
BUN: 12 mg/dL (ref 6–24)
CHLORIDE: 97 mmol/L (ref 96–106)
CO2: 23 mmol/L (ref 20–29)
Calcium: 10 mg/dL (ref 8.7–10.2)
Creatinine, Ser: 1.04 mg/dL (ref 0.76–1.27)
GFR calc Af Amer: 101 mL/min/{1.73_m2} (ref >59)
GFR calc non Af Amer: 88 mL/min/{1.73_m2} (ref >59)
Glucose: 111 mg/dL — ABNORMAL HIGH (ref 65–99)
Potassium: 5 mmol/L (ref 3.5–5.2)
Sodium: 139 mmol/L (ref 134–144)

## 2018-11-26 ENCOUNTER — Ambulatory Visit: Payer: BLUE CROSS/BLUE SHIELD | Admitting: Family Medicine

## 2019-01-10 ENCOUNTER — Other Ambulatory Visit: Payer: Self-pay | Admitting: Family Medicine

## 2019-01-10 NOTE — Telephone Encounter (Signed)
Forwarding medication refill requests to PCP for review.

## 2019-01-30 ENCOUNTER — Other Ambulatory Visit: Payer: Self-pay

## 2019-01-30 DIAGNOSIS — Z20822 Contact with and (suspected) exposure to covid-19: Secondary | ICD-10-CM

## 2019-02-01 LAB — NOVEL CORONAVIRUS, NAA: SARS-CoV-2, NAA: NOT DETECTED

## 2019-02-11 DIAGNOSIS — Z23 Encounter for immunization: Secondary | ICD-10-CM | POA: Diagnosis not present

## 2019-04-23 ENCOUNTER — Other Ambulatory Visit: Payer: Self-pay

## 2019-04-23 NOTE — Telephone Encounter (Signed)
Last office visit in February 2020 and A1C 8.0%.  Needs office visit scheduled and then will provide enough medication to make to visit once visit scheduled.

## 2019-04-23 NOTE — Telephone Encounter (Signed)
[facility] request on Farxiga 10 mg tab

## 2019-04-24 NOTE — Telephone Encounter (Signed)
Called and spoke with patient. He stated that will call back to schedule a follow up.

## 2019-04-27 ENCOUNTER — Other Ambulatory Visit: Payer: Self-pay | Admitting: Nurse Practitioner

## 2019-04-27 MED ORDER — FARXIGA 10 MG PO TABS: 10.0000 mg | ORAL_TABLET | Freq: Every day | ORAL | 0 refills | Status: DC

## 2019-04-27 NOTE — Telephone Encounter (Signed)
Sent 30 day supply only, but he will need appointment for further.

## 2019-04-28 NOTE — Telephone Encounter (Signed)
Patient notified. Patient stated that he will call back to schedule a follow up.

## 2019-06-05 ENCOUNTER — Other Ambulatory Visit: Payer: Self-pay

## 2019-06-05 NOTE — Telephone Encounter (Signed)
Refill request for Januvia LOV:  05/26/2018 Next Appt: none

## 2019-06-07 MED ORDER — SITAGLIPTIN PHOSPHATE 100 MG PO TABS: 100.0000 mg | ORAL_TABLET | Freq: Every day | ORAL | 0 refills | Status: DC

## 2019-07-02 ENCOUNTER — Other Ambulatory Visit: Payer: Self-pay | Admitting: Family Medicine

## 2019-07-02 NOTE — Telephone Encounter (Signed)
Needs appointment before refill, not seen since 02/20. Has already been given one courtesy refill previously.

## 2019-07-02 NOTE — Telephone Encounter (Signed)
Requested  medications are  due for refill today yes  Requested medications are on the active medication list yes  Last refill 06/07/19  Future visit scheduled no  Notes to clinic last office visit 13 months ago, has already had one  curtesy refill.

## 2019-07-02 NOTE — Telephone Encounter (Signed)
LVM for pt to schedule appt

## 2019-07-06 ENCOUNTER — Encounter: Payer: Self-pay | Admitting: Family Medicine

## 2019-07-06 NOTE — Telephone Encounter (Signed)
Lvm for pt as well as sent letter through [employer] and mail.

## 2019-08-07 ENCOUNTER — Other Ambulatory Visit: Payer: Self-pay

## 2019-08-07 ENCOUNTER — Ambulatory Visit (INDEPENDENT_AMBULATORY_CARE_PROVIDER_SITE_OTHER): Payer: BC Managed Care – PPO | Admitting: Family Medicine

## 2019-08-07 ENCOUNTER — Encounter: Payer: Self-pay | Admitting: Family Medicine

## 2019-08-07 VITALS — BP 122/82 | HR 76 | Temp 97.9°F | Ht 66.5 in | Wt 183.0 lb

## 2019-08-07 DIAGNOSIS — Z23 Encounter for immunization: Secondary | ICD-10-CM | POA: Diagnosis not present

## 2019-08-07 DIAGNOSIS — G4733 Obstructive sleep apnea (adult) (pediatric): Secondary | ICD-10-CM

## 2019-08-07 DIAGNOSIS — E785 Hyperlipidemia, unspecified: Secondary | ICD-10-CM | POA: Diagnosis not present

## 2019-08-07 DIAGNOSIS — Z0000 Encounter for general adult medical examination without abnormal findings: Secondary | ICD-10-CM | POA: Diagnosis not present

## 2019-08-07 DIAGNOSIS — E119 Type 2 diabetes mellitus without complications: Secondary | ICD-10-CM | POA: Diagnosis not present

## 2019-08-07 LAB — UA/M W/RFLX CULTURE, ROUTINE
Bilirubin, UA: NEGATIVE
Leukocytes,UA: NEGATIVE
Nitrite, UA: NEGATIVE
Protein,UA: NEGATIVE
RBC, UA: NEGATIVE
Specific Gravity, UA: 1.01 (ref 1.005–1.030)
Urobilinogen, Ur: 0.2 mg/dL (ref 0.2–1.0)
pH, UA: 5 (ref 5.0–7.5)

## 2019-08-07 LAB — LIPID PANEL PICCOLO, WAIVED
Chol/HDL Ratio Piccolo,Waive: 3.6 mg/dL
Cholesterol Piccolo, Waived: 157 mg/dL (ref <200)
HDL Chol Piccolo, Waived: 44 mg/dL — ABNORMAL LOW (ref >59)
LDL Chol Calc Piccolo Waived: 92 mg/dL (ref <100)
Triglycerides Piccolo,Waived: 106 mg/dL (ref <150)
VLDL Chol Calc Piccolo,Waive: 21 mg/dL (ref <30)

## 2019-08-07 LAB — BAYER DCA HB A1C WAIVED: HB A1C (BAYER DCA - WAIVED): 10.3 % — ABNORMAL HIGH (ref <7.0)

## 2019-08-07 MED ORDER — ATORVASTATIN CALCIUM 20 MG PO TABS: 20.0000 mg | ORAL_TABLET | Freq: Every day | ORAL | 1 refills | Status: DC

## 2019-08-07 MED ORDER — METFORMIN HCL 1000 MG PO TABS: 1000.0000 mg | ORAL_TABLET | Freq: Two times a day (BID) | ORAL | 1 refills | Status: DC

## 2019-08-07 MED ORDER — OZEMPIC (0.25 OR 0.5 MG/DOSE) 2 MG/1.5ML ~~LOC~~ SOPN: 0.2500 mg | PEN_INJECTOR | SUBCUTANEOUS | 0 refills | Status: DC

## 2019-08-07 MED ORDER — FARXIGA 10 MG PO TABS: 10.0000 mg | ORAL_TABLET | Freq: Every day | ORAL | 0 refills | Status: DC

## 2019-08-07 NOTE — Assessment & Plan Note (Signed)
Recheck lipids, adjust as needed. Continue current regimen

## 2019-08-07 NOTE — Assessment & Plan Note (Signed)
Stable on CPAP, recommended more consistent use and continued good weight control

## 2019-08-07 NOTE — Assessment & Plan Note (Signed)
Not at goal with A1C of 10.3 today. Add ozempic, continue working on diet. Recheck in 3 months

## 2019-08-07 NOTE — Progress Notes (Signed)
BP 122/82   Pulse 76   Temp 97.9 F (36.6 C) (Oral)   Ht 5' 6.5" (1.689 m)   Wt 183 lb (83 kg)   SpO2 98%   BMI 29.09 kg/m    Subjective:    Patient ID: Aaron Wood, male    DOB: 1976/02/15, 44 y.o.   MRN: [id number]  HPI: Aaron Wood is a 44 y.o. male presenting on 08/07/2019 for comprehensive medical examination. Current medical complaints include:see below  DM - concerned his medication isn't working well, home BSs have been running around 200 fasting though does not check it regularly. Last A1C 8.0 in February of 2020. Does not follow strict diet but does exercise regularly. Denies low blood sugar spells and states he's taking medicines faithfully.   HLD - tolerating lipitor well. Denies CP, SOB, claudication, myalgias.   OSA - uses CPAP sometimes. Helps when using it.   He currently lives with: Interim Problems from his last visit: no  Depression Screen done today and results listed below:  Depression screen [facility] 2/9 08/07/2019 02/20/2018  Decreased Interest 0 0  Down, Depressed, Hopeless 0 0  PHQ - 2 Score 0 0  Altered sleeping 0 0  Tired, decreased energy 1 0  Change in appetite 0 0  Feeling bad or failure about yourself  0 0  Trouble concentrating 0 0  Moving slowly or fidgety/restless 0 0  Suicidal thoughts 0 0  PHQ-9 Score 1 0  Difficult doing work/chores - Not difficult at all    The patient does not have a history of falls. I did complete a risk assessment for falls. A plan of care for falls was documented.   Past Medical History:  History reviewed. No pertinent past medical history.  Surgical History:  History reviewed. No pertinent surgical history.  Medications:  Current Outpatient Medications on File Prior to Visit  Medication Sig  . sitaGLIPtin (JANUVIA) 100 MG tablet Take 1 tablet (100 mg total) by mouth daily.   No current facility-administered medications on file prior to visit.    Allergies:  No Known Allergies  Social History:    Social History   Socioeconomic History  . Marital status: Married    Spouse name: Not on file  . Number of children: Not on file  . Years of education: Not on file  . Highest education level: Not on file  Occupational History  . Not on file  Tobacco Use  . Smoking status: Never Smoker  . Smokeless tobacco: Never Used  Substance and Sexual Activity  . Alcohol use: Yes    Comment: occasionally  . Drug use: Never  . Sexual activity: Not on file  Other Topics Concern  . Not on file  Social History Narrative  . Not on file   Social Determinants of Health   Financial Resource Strain:   . Difficulty of Paying Living Expenses:   Food Insecurity:   . Worried About Charity fundraiser in the Last Year:   . Arboriculturist in the Last Year:   Transportation Needs:   . Film/video editor (Medical):   Marland Kitchen Lack of Transportation (Non-Medical):   Physical Activity:   . Days of Exercise per Week:   . Minutes of Exercise per Session:   Stress:   . Feeling of Stress :   Social Connections:   . Frequency of Communication with Friends and Family:   . Frequency of Social Gatherings with Friends and Family:   .  Attends Religious Services:   . Active Member of Clubs or Organizations:   . Attends Archivist Meetings:   Marland Kitchen Marital Status:   Intimate Partner Violence:   . Fear of Current or Ex-Partner:   . Emotionally Abused:   Marland Kitchen Physically Abused:   . Sexually Abused:    Social History   Tobacco Use  Smoking Status Never Smoker  Smokeless Tobacco Never Used   Social History   Substance and Sexual Activity  Alcohol Use Yes   Comment: occasionally    Family History:  History reviewed. No pertinent family history.  Past medical history, surgical history, medications, allergies, family history and social history reviewed with patient today and changes made to appropriate areas of the chart.   Review of Systems - General ROS: negative Psychological ROS:  negative Ophthalmic ROS: negative ENT ROS: negative Allergy and Immunology ROS: negative Hematological and Lymphatic ROS: negative Endocrine ROS: negative Breast ROS: negative for breast lumps Respiratory ROS: no cough, shortness of breath, or wheezing Cardiovascular ROS: no chest pain or dyspnea on exertion Gastrointestinal ROS: no abdominal pain, change in bowel habits, or black or bloody stools Genito-Urinary ROS: no dysuria, trouble voiding, or hematuria Musculoskeletal ROS: negative Neurological ROS: no TIA or stroke symptoms Dermatological ROS: negative All other ROS negative except what is listed above and in the HPI.      Objective:    BP 122/82   Pulse 76   Temp 97.9 F (36.6 C) (Oral)   Ht 5' 6.5" (1.689 m)   Wt 183 lb (83 kg)   SpO2 98%   BMI 29.09 kg/m   Wt Readings from Last 3 Encounters:  08/07/19 183 lb (83 kg)  05/26/18 183 lb (83 kg)  02/20/18 179 lb 6 oz (81.4 kg)    Physical Exam Vitals and nursing note reviewed.  Constitutional:      General: He is not in acute distress.    Appearance: He is well-developed.  HENT:     Head: Atraumatic.     Right Ear: Tympanic membrane and external ear normal.     Left Ear: Tympanic membrane and external ear normal.     Nose: Nose normal.     Mouth/Throat:     Mouth: Mucous membranes are moist.     Pharynx: Oropharynx is clear.  Eyes:     General: No scleral icterus.    Conjunctiva/sclera: Conjunctivae normal.     Pupils: Pupils are equal, round, and reactive to light.  Cardiovascular:     Rate and Rhythm: Normal rate and regular rhythm.     Heart sounds: Normal heart sounds. No murmur.  Pulmonary:     Effort: Pulmonary effort is normal. No respiratory distress.     Breath sounds: Normal breath sounds.  Abdominal:     General: Bowel sounds are normal. There is no distension.     Palpations: Abdomen is soft. There is no mass.     Tenderness: There is no abdominal tenderness. There is no guarding.   Genitourinary:    Comments: Declines GU exam Musculoskeletal:        General: No tenderness. Normal range of motion.     Cervical back: Normal range of motion and neck supple.  Skin:    General: Skin is warm and dry.     Findings: No rash.  Neurological:     General: No focal deficit present.     Mental Status: He is alert and oriented to person, place, and time.  Deep Tendon Reflexes: Reflexes are normal and symmetric.  Psychiatric:        Mood and Affect: Mood normal.        Behavior: Behavior normal.        Thought Content: Thought content normal.        Judgment: Judgment normal.    Diabetic Foot Exam - Simple   Simple Foot Form Diabetic Foot exam was performed with the following findings: Yes 08/07/2019  9:09 AM  Visual Inspection No deformities, no ulcerations, no other skin breakdown bilaterally: Yes Sensation Testing Intact to touch and monofilament testing bilaterally: Yes Pulse Check Posterior Tibialis and Dorsalis pulse intact bilaterally: Yes Comments     Results for orders placed or performed in visit on 01/30/19  Novel Coronavirus, NAA ([facility])   Specimen: Nasopharyngeal(NP) swabs in vial transport medium   NASOPHARYNGE  TESTING  Result Value Ref Range   SARS-CoV-2, NAA Not Detected Not Detected      Assessment & Plan:   Problem List Items Addressed This Visit      Respiratory   OSA (obstructive sleep apnea)    Stable on CPAP, recommended more consistent use and continued good weight control        Endocrine   DM2 (diabetes mellitus, type 2) ([place])    Not at goal with A1C of 10.3 today. Add ozempic, continue working on diet. Recheck in 3 months      Relevant Medications   Semaglutide,0.25 or 0.5MG/DOS, (OZEMPIC, 0.25 OR 0.5 MG/DOSE,) 2 MG/1.5ML SOPN   metFORMIN (GLUCOPHAGE) 1000 MG tablet   dapagliflozin propanediol (FARXIGA) 10 MG TABS tablet   atorvastatin (LIPITOR) 20 MG tablet   Other Relevant Orders   Comprehensive metabolic panel    UA/M w/rflx Culture, Routine   Bayer DCA Hb A1c Waived     Other   Hyperlipemia - Primary    Recheck lipids, adjust as needed. Continue current regimen      Relevant Medications   atorvastatin (LIPITOR) 20 MG tablet   Other Relevant Orders   Lipid Panel Piccolo, Waived    Other Visit Diagnoses    Annual physical exam       Relevant Orders   CBC with Differential/Platelet   TSH   Need for diphtheria-tetanus-pertussis (Tdap) vaccine       Relevant Orders   Tdap vaccine greater than or equal to 7yo IM (Completed)       Discussed aspirin prophylaxis for myocardial infarction prevention and decision was it was recommended  LABORATORY TESTING:  Health maintenance labs ordered today as discussed above.   The natural history of prostate cancer and ongoing controversy regarding screening and potential treatment outcomes of prostate cancer has been discussed with the patient. The meaning of a false positive PSA and a false negative PSA has been discussed. He indicates understanding of the limitations of this screening test and wishes not to proceed with screening PSA testing.   IMMUNIZATIONS:   - Tdap: Tetanus vaccination status reviewed: Td vaccination indicated and given today. - Influenza: Up to date  PATIENT COUNSELING:    Sexuality: Discussed sexually transmitted diseases, partner selection, use of condoms, avoidance of unintended pregnancy  and contraceptive alternatives.   Advised to avoid cigarette smoking.  I discussed with the patient that most people either abstain from alcohol or drink within safe limits (<=14/week and <=4 drinks/occasion for males, <=7/weeks and <= 3 drinks/occasion for females) and that the risk for alcohol disorders and other health effects rises proportionally with the number  of drinks per week and how often a drinker exceeds daily limits.  Discussed cessation/primary prevention of drug use and availability of treatment for abuse.   Diet:  Encouraged to adjust caloric intake to maintain  or achieve ideal body weight, to reduce intake of dietary saturated fat and total fat, to limit sodium intake by avoiding high sodium foods and not adding table salt, and to maintain adequate dietary potassium and calcium preferably from fresh fruits, vegetables, and low-fat dairy products.    stressed the importance of regular exercise  Injury prevention: Discussed safety belts, safety helmets, smoke detector, smoking near bedding or upholstery.   Dental health: Discussed importance of regular tooth brushing, flossing, and dental visits.   Follow up plan: NEXT PREVENTATIVE PHYSICAL DUE IN 1 YEAR. Return in about 3 months (around 11/06/2019) for DM.

## 2019-08-08 LAB — COMPREHENSIVE METABOLIC PANEL
ALT: 9 IU/L (ref 0–44)
AST: 12 IU/L (ref 0–40)
Albumin/Globulin Ratio: 1.9 (ref 1.2–2.2)
Albumin: 4.5 g/dL (ref 4.0–5.0)
Alkaline Phosphatase: 68 IU/L (ref 39–117)
BUN/Creatinine Ratio: 12 (ref 9–20)
BUN: 12 mg/dL (ref 6–24)
Bilirubin Total: 0.3 mg/dL (ref 0.0–1.2)
CO2: 20 mmol/L (ref 20–29)
Calcium: 9.5 mg/dL (ref 8.7–10.2)
Chloride: 101 mmol/L (ref 96–106)
Creatinine, Ser: 1.02 mg/dL (ref 0.76–1.27)
GFR calc Af Amer: 102 mL/min/{1.73_m2} (ref >59)
GFR calc non Af Amer: 88 mL/min/{1.73_m2} (ref >59)
Globulin, Total: 2.4 g/dL (ref 1.5–4.5)
Glucose: 131 mg/dL — ABNORMAL HIGH (ref 65–99)
Potassium: 4.7 mmol/L (ref 3.5–5.2)
Sodium: 137 mmol/L (ref 134–144)
Total Protein: 6.9 g/dL (ref 6.0–8.5)

## 2019-08-08 LAB — CBC WITH DIFFERENTIAL/PLATELET
Basophils Absolute: 0 10*3/uL (ref 0.0–0.2)
Basos: 1 %
EOS (ABSOLUTE): 0.2 10*3/uL (ref 0.0–0.4)
Eos: 3 %
Hematocrit: 45.9 % (ref 37.5–51.0)
Hemoglobin: 15.6 g/dL (ref 13.0–17.7)
Immature Grans (Abs): 0 10*3/uL (ref 0.0–0.1)
Immature Granulocytes: 0 %
Lymphocytes Absolute: 1.6 10*3/uL (ref 0.7–3.1)
Lymphs: 27 %
MCH: 29.9 pg (ref 26.6–33.0)
MCHC: 34 g/dL (ref 31.5–35.7)
MCV: 88 fL (ref 79–97)
Monocytes Absolute: 0.5 10*3/uL (ref 0.1–0.9)
Monocytes: 8 %
Neutrophils Absolute: 3.6 10*3/uL (ref 1.4–7.0)
Neutrophils: 61 %
Platelets: 291 10*3/uL (ref 150–450)
RBC: 5.22 x10E6/uL (ref 4.14–5.80)
RDW: 12.9 % (ref 11.6–15.4)
WBC: 5.9 10*3/uL (ref 3.4–10.8)

## 2019-08-08 LAB — TSH: TSH: 1.62 u[IU]/mL (ref 0.450–4.500)

## 2019-08-28 ENCOUNTER — Other Ambulatory Visit: Payer: Self-pay | Admitting: Family Medicine

## 2019-08-28 MED ORDER — SITAGLIPTIN PHOSPHATE 100 MG PO TABS: 100.0000 mg | ORAL_TABLET | Freq: Every day | ORAL | 1 refills | Status: DC

## 2019-08-28 MED ORDER — OZEMPIC (0.25 OR 0.5 MG/DOSE) 2 MG/1.5ML ~~LOC~~ SOPN: 0.2500 mg | PEN_INJECTOR | SUBCUTANEOUS | 1 refills | Status: DC

## 2019-09-28 ENCOUNTER — Other Ambulatory Visit: Payer: Self-pay | Admitting: Family Medicine

## 2019-09-28 MED ORDER — OZEMPIC (0.25 OR 0.5 MG/DOSE) 2 MG/1.5ML ~~LOC~~ SOPN: 0.2500 mg | PEN_INJECTOR | SUBCUTANEOUS | 1 refills | Status: DC

## 2019-09-28 NOTE — Telephone Encounter (Signed)
Semaglutide,0.25 or 0.5MG/DOS, (OZEMPIC, 0.25 OR 0.5 MG/DOSE,) 2 MG/1.5ML SOPN      Patient is requesting new prescription reflecting new dosage of .05 as discussed with Merrie Roof when prescribed.    [facility]  [id number] [place] [address] Phone:  [phone] Fax:  [phone]

## 2019-09-28 NOTE — Telephone Encounter (Signed)
Routing to provider  

## 2019-10-22 ENCOUNTER — Other Ambulatory Visit: Payer: Self-pay | Admitting: Family Medicine

## 2019-10-29 ENCOUNTER — Other Ambulatory Visit: Payer: Self-pay | Admitting: Family Medicine

## 2019-11-11 ENCOUNTER — Ambulatory Visit: Payer: BC Managed Care – PPO | Admitting: Family Medicine

## 2019-11-17 ENCOUNTER — Other Ambulatory Visit: Payer: Self-pay | Admitting: Family Medicine

## 2019-11-23 ENCOUNTER — Other Ambulatory Visit: Payer: Self-pay | Admitting: Family Medicine

## 2019-11-23 NOTE — Telephone Encounter (Signed)
Medication Refill - Medication: FARXIGA 10 MG TABS tablet   Has the patient contacted their pharmacy? Yes.   (Agent: If no, request that the patient contact the pharmacy for the refill.) (Agent: If yes, when and what did the pharmacy advise?)  Preferred Pharmacy (with phone number or street name):  [id number] [id number] Phone: [phone]: [phone]   Agent: Please be advised that RX refills may take up to 3 business days. We ask that you follow-up with your pharmacy.

## 2019-11-23 NOTE — Telephone Encounter (Signed)
Spoke with pharmacy and they have script on file and will fill for patient

## 2019-11-30 ENCOUNTER — Ambulatory Visit (INDEPENDENT_AMBULATORY_CARE_PROVIDER_SITE_OTHER): Payer: BC Managed Care – PPO | Admitting: Family Medicine

## 2019-11-30 ENCOUNTER — Encounter: Payer: Self-pay | Admitting: Family Medicine

## 2019-11-30 ENCOUNTER — Other Ambulatory Visit: Payer: Self-pay

## 2019-11-30 VITALS — BP 114/78 | HR 93 | Temp 98.1°F | Wt 165.0 lb

## 2019-11-30 DIAGNOSIS — Z1159 Encounter for screening for other viral diseases: Secondary | ICD-10-CM

## 2019-11-30 DIAGNOSIS — E119 Type 2 diabetes mellitus without complications: Secondary | ICD-10-CM | POA: Diagnosis not present

## 2019-11-30 LAB — BAYER DCA HB A1C WAIVED: HB A1C (BAYER DCA - WAIVED): 6.7 % (ref <7.0)

## 2019-11-30 MED ORDER — OZEMPIC (0.25 OR 0.5 MG/DOSE) 2 MG/1.5ML ~~LOC~~ SOPN: 0.5000 mg | PEN_INJECTOR | SUBCUTANEOUS | 2 refills | Status: DC

## 2019-11-30 NOTE — Progress Notes (Signed)
BP 114/78   Pulse 93   Temp 98.1 F (36.7 C) (Oral)   Wt 165 lb (74.8 kg)   SpO2 98%   BMI 26.23 kg/m    Subjective:    Patient ID: Aaron Wood, male    DOB: 07/20/1975, 44 y.o.   MRN: [id number]  HPI: Aaron Wood is a 44 y.o. male  Chief Complaint  Patient presents with  . Diabetes    pt would like to know the A1c results today while in office   Here today for 3 month DM f/u after adding ozempic. Has had occasional nausea but overall feeling well on medications. Home BSs running around 120s-140s range. Diet and exercise doing well overall. Denies low blood sugar spells. On the ozempic 0.5 mg now the past 2 months.   Relevant past medical, surgical, family and social history reviewed and updated as indicated. Interim medical history since our last visit reviewed. Allergies and medications reviewed and updated.  Review of Systems  Per HPI unless specifically indicated above     Objective:    BP 114/78   Pulse 93   Temp 98.1 F (36.7 C) (Oral)   Wt 165 lb (74.8 kg)   SpO2 98%   BMI 26.23 kg/m   Wt Readings from Last 3 Encounters:  11/30/19 165 lb (74.8 kg)  08/07/19 183 lb (83 kg)  05/26/18 183 lb (83 kg)    Physical Exam Vitals and nursing note reviewed.  Constitutional:      Appearance: Normal appearance.  HENT:     Head: Atraumatic.  Eyes:     Extraocular Movements: Extraocular movements intact.     Conjunctiva/sclera: Conjunctivae normal.  Cardiovascular:     Rate and Rhythm: Normal rate and regular rhythm.  Pulmonary:     Effort: Pulmonary effort is normal.     Breath sounds: Normal breath sounds.  Musculoskeletal:        General: Normal range of motion.     Cervical back: Normal range of motion and neck supple.  Skin:    General: Skin is warm and dry.  Neurological:     General: No focal deficit present.     Mental Status: He is oriented to person, place, and time.  Psychiatric:        Mood and Affect: Mood normal.        Thought  Content: Thought content normal.        Judgment: Judgment normal.     Results for orders placed or performed in visit on 08/07/19  CBC with Differential/Platelet  Result Value Ref Range   WBC 5.9 3.4 - 10.8 x10E3/uL   RBC 5.22 4.14 - 5.80 x10E6/uL   Hemoglobin 15.6 13.0 - 17.7 g/dL   Hematocrit 45.9 37.5 - 51.0 %   MCV 88 79 - 97 fL   MCH 29.9 26.6 - 33.0 pg   MCHC 34.0 31 - 35 g/dL   RDW 12.9 11.6 - 15.4 %   Platelets 291 150 - 450 x10E3/uL   Neutrophils 61 Not Estab. %   Lymphs 27 Not Estab. %   Monocytes 8 Not Estab. %   Eos 3 Not Estab. %   Basos 1 Not Estab. %   Neutrophils Absolute 3.6 1 - 7 x10E3/uL   Lymphocytes Absolute 1.6 0 - 3 x10E3/uL   Monocytes Absolute 0.5 0 - 0 x10E3/uL   EOS (ABSOLUTE) 0.2 0.0 - 0.4 x10E3/uL   Basophils Absolute 0.0 0 - 0 x10E3/uL  Immature Granulocytes 0 Not Estab. %   Immature Grans (Abs) 0.0 0.0 - 0.1 x10E3/uL  Comprehensive metabolic panel  Result Value Ref Range   Glucose 131 (H) 65 - 99 mg/dL   BUN 12 6 - 24 mg/dL   Creatinine, Ser 1.02 0.76 - 1.27 mg/dL   GFR calc non Af Amer 88 >59 mL/min/1.73   GFR calc Af Amer 102 >59 mL/min/1.73   BUN/Creatinine Ratio 12 9 - 20   Sodium 137 134 - 144 mmol/L   Potassium 4.7 3.5 - 5.2 mmol/L   Chloride 101 96 - 106 mmol/L   CO2 20 20 - 29 mmol/L   Calcium 9.5 8.7 - 10.2 mg/dL   Total Protein 6.9 6.0 - 8.5 g/dL   Albumin 4.5 4.0 - 5.0 g/dL   Globulin, Total 2.4 1.5 - 4.5 g/dL   Albumin/Globulin Ratio 1.9 1.2 - 2.2   Bilirubin Total 0.3 0.0 - 1.2 mg/dL   Alkaline Phosphatase 68 39 - 117 IU/L   AST 12 0 - 40 IU/L   ALT 9 0 - 44 IU/L  TSH  Result Value Ref Range   TSH 1.620 0.450 - 4.500 uIU/mL  UA/M w/rflx Culture, Routine   Specimen: Urine   URINE  Result Value Ref Range   Specific Gravity, UA 1.010 1.005 - 1.030   pH, UA 5.0 5.0 - 7.5   Color, UA Yellow Yellow   Appearance Ur Clear Clear   Leukocytes,UA Negative Negative   Protein,UA Negative Negative/Trace   Glucose, UA 3+  (A) Negative   Ketones, UA Trace (A) Negative   RBC, UA Negative Negative   Bilirubin, UA Negative Negative   Urobilinogen, Ur 0.2 0.2 - 1.0 mg/dL   Nitrite, UA Negative Negative  Bayer DCA Hb A1c Waived  Result Value Ref Range   HB A1C (BAYER DCA - WAIVED) 10.3 (H) <7.0 %  Lipid Panel Piccolo, Waived  Result Value Ref Range   Cholesterol Piccolo, Waived 157 <200 mg/dL   HDL Chol Piccolo, Waived 44 (L) >59 mg/dL   Triglycerides Piccolo,Waived 106 <150 mg/dL   Chol/HDL Ratio Piccolo,Waive 3.6 mg/dL   LDL Chol Calc Piccolo Waived 92 <100 mg/dL   VLDL Chol Calc Piccolo,Waive 21 <30 mg/dL      Assessment & Plan:   Problem List Items Addressed This Visit      Endocrine   DM2 (diabetes mellitus, type 2) (HCC)    Hg A1C stable and at goal at 6.7, continue present medications Celesta Gentile, farxiga, metformin, ozempic) and lifestyle changes.       Relevant Medications   Semaglutide,0.25 or 0.5MG/DOS, (OZEMPIC, 0.25 OR 0.5 MG/DOSE,) 2 MG/1.5ML SOPN   Other Relevant Orders   Bayer DCA Hb A1c Waived    Other Visit Diagnoses    Need for hepatitis C screening test    -  Primary   Relevant Orders   Hepatitis C antibody       Follow up plan: Return in about 3 months (around 03/01/2020) for 6 month f/u.

## 2019-11-30 NOTE — Assessment & Plan Note (Signed)
Hg A1C stable and at goal at 6.7, continue present medications Celesta Gentile, farxiga, metformin, ozempic) and lifestyle changes.

## 2019-12-01 ENCOUNTER — Other Ambulatory Visit: Payer: Self-pay | Admitting: Family Medicine

## 2019-12-01 LAB — HEPATITIS C ANTIBODY: Hep C Virus Ab: <0.1 s/co ratio (ref 0.0–0.9)

## 2020-01-19 ENCOUNTER — Other Ambulatory Visit: Payer: Self-pay

## 2020-01-19 MED ORDER — METFORMIN HCL 1000 MG PO TABS: 1000.0000 mg | ORAL_TABLET | Freq: Two times a day (BID) | ORAL | 0 refills | Status: DC

## 2020-02-14 ENCOUNTER — Other Ambulatory Visit: Payer: Self-pay | Admitting: Family Medicine

## 2020-02-28 ENCOUNTER — Encounter: Payer: Self-pay | Admitting: Nurse Practitioner

## 2020-03-01 ENCOUNTER — Ambulatory Visit (INDEPENDENT_AMBULATORY_CARE_PROVIDER_SITE_OTHER): Payer: BC Managed Care – PPO | Admitting: Nurse Practitioner

## 2020-03-01 ENCOUNTER — Other Ambulatory Visit: Payer: Self-pay

## 2020-03-01 ENCOUNTER — Encounter: Payer: Self-pay | Admitting: Nurse Practitioner

## 2020-03-01 VITALS — BP 112/77 | HR 90 | Temp 98.3°F

## 2020-03-01 DIAGNOSIS — E785 Hyperlipidemia, unspecified: Secondary | ICD-10-CM | POA: Diagnosis not present

## 2020-03-01 DIAGNOSIS — E119 Type 2 diabetes mellitus without complications: Secondary | ICD-10-CM

## 2020-03-01 DIAGNOSIS — E538 Deficiency of other specified B group vitamins: Secondary | ICD-10-CM

## 2020-03-01 DIAGNOSIS — E1169 Type 2 diabetes mellitus with other specified complication: Secondary | ICD-10-CM

## 2020-03-01 LAB — MICROALBUMIN, URINE WAIVED
Creatinine, Urine Waived: 50 mg/dL (ref 10–300)
Microalb, Ur Waived: 10 mg/L (ref 0–19)

## 2020-03-01 LAB — BAYER DCA HB A1C WAIVED: HB A1C (BAYER DCA - WAIVED): 7 % — ABNORMAL HIGH (ref <7.0)

## 2020-03-01 MED ORDER — METFORMIN HCL 1000 MG PO TABS: 1000.0000 mg | ORAL_TABLET | Freq: Two times a day (BID) | ORAL | 4 refills | Status: DC

## 2020-03-01 MED ORDER — SITAGLIPTIN PHOSPHATE 100 MG PO TABS
100.0000 mg | ORAL_TABLET | Freq: Every day | ORAL | 4 refills | Status: DC
Start: 2020-03-01 — End: 2021-03-14

## 2020-03-01 MED ORDER — DAPAGLIFLOZIN PROPANEDIOL 10 MG PO TABS
10.0000 mg | ORAL_TABLET | Freq: Every day | ORAL | 4 refills | Status: DC
Start: 2020-03-01 — End: 2021-03-14

## 2020-03-01 MED ORDER — OZEMPIC (0.25 OR 0.5 MG/DOSE) 2 MG/1.5ML ~~LOC~~ SOPN: 0.5000 mg | PEN_INJECTOR | SUBCUTANEOUS | 4 refills | Status: DC

## 2020-03-01 NOTE — Progress Notes (Signed)
BP 112/77   Pulse 90   Temp 98.3 F (36.8 C)   SpO2 98%    Subjective:    Patient ID: Aaron Wood, male    DOB: 1975/11/22, 44 y.o.   MRN: [id number]  HPI: DMARCO BALDUS is a 44 y.o. male  Chief Complaint  Patient presents with  . Diabetes   DIABETES Continues on Januvia, Farxiga, Ozempic 0.5 MG, and Metformin with last A1C 6.7% in August.   Hypoglycemic episodes:no Polydipsia/polyuria: no Visual disturbance: no Chest pain: no Paresthesias: no Glucose Monitoring: no  Accucheck frequency: Not Checking  Fasting glucose:  Post prandial:  Evening:  Before meals: Taking Insulin?: no  Long acting insulin:  Short acting insulin: Blood Pressure Monitoring: not checking Retinal Examination: Not up to Date Foot Exam: Up to Date Pneumovax: Not up to Date Influenza: Not up to Date Aspirin: no   HYPERLIPIDEMIA Continues on daily Atorvastatin. Hyperlipidemia status: good compliance Satisfied with current treatment?  yes Side effects:  no Medication compliance: good compliance Supplements: none Aspirin:  no The 10-year ASCVD risk score Mikey Bussing DC Jr., et al., 2013) is: 2.4%   Values used to calculate the score:     Age: 50 years     Sex: Male     Is Non-Hispanic African American: No     Diabetic: Yes     Tobacco smoker: No     Systolic Blood Pressure: 226 mmHg     Is BP treated: No     HDL Cholesterol: 44 mg/dL     Total Cholesterol: 157 mg/dL Chest pain:  no Coronary artery disease:  no Family history CAD:  no Family history early CAD:  no  Relevant past medical, surgical, family and social history reviewed and updated as indicated. Interim medical history since our last visit reviewed. Allergies and medications reviewed and updated.  Review of Systems  Constitutional: Negative for activity change, diaphoresis, fatigue and fever.  Respiratory: Negative for cough, chest tightness, shortness of breath and wheezing.   Cardiovascular: Negative for chest pain,  palpitations and leg swelling.  Gastrointestinal: Negative.   Endocrine: Negative for cold intolerance, heat intolerance, polydipsia, polyphagia and polyuria.  Neurological: Negative.   Psychiatric/Behavioral: Negative.     Per HPI unless specifically indicated above     Objective:    BP 112/77   Pulse 90   Temp 98.3 F (36.8 C)   SpO2 98%   Wt Readings from Last 3 Encounters:  11/30/19 165 lb (74.8 kg)  08/07/19 183 lb (83 kg)  05/26/18 183 lb (83 kg)    Physical Exam Vitals and nursing note reviewed.  Constitutional:      General: He is awake. He is not in acute distress.    Appearance: He is well-developed and well-groomed. He is not ill-appearing.  HENT:     Head: Normocephalic and atraumatic.     Right Ear: Hearing normal. No drainage.     Left Ear: Hearing normal. No drainage.  Eyes:     General: Lids are normal.        Right eye: No discharge.        Left eye: No discharge.     Conjunctiva/sclera: Conjunctivae normal.     Pupils: Pupils are equal, round, and reactive to light.  Neck:     Thyroid: No thyromegaly.     Vascular: No carotid bruit.     Trachea: Trachea normal.  Cardiovascular:     Rate and Rhythm: Normal rate and regular  rhythm.     Heart sounds: Normal heart sounds, S1 normal and S2 normal. No murmur heard.  No gallop.   Pulmonary:     Effort: Pulmonary effort is normal. No accessory muscle usage or respiratory distress.     Breath sounds: Normal breath sounds.  Abdominal:     General: Bowel sounds are normal.     Palpations: Abdomen is soft. There is no hepatomegaly or splenomegaly.  Musculoskeletal:        General: Normal range of motion.     Cervical back: Normal range of motion and neck supple.     Right lower leg: No edema.     Left lower leg: No edema.  Skin:    General: Skin is warm and dry.     Capillary Refill: Capillary refill takes less than 2 seconds.     Findings: No rash.  Neurological:     Mental Status: He is alert and  oriented to person, place, and time.     Deep Tendon Reflexes: Reflexes are normal and symmetric.  Psychiatric:        Attention and Perception: Attention normal.        Mood and Affect: Mood normal.        Speech: Speech normal.        Behavior: Behavior normal. Behavior is cooperative.        Thought Content: Thought content normal.     Results for orders placed or performed in visit on 11/30/19  Hepatitis C antibody  Result Value Ref Range   Hep C Virus Ab <0.1 0.0 - 0.9 s/co ratio  Bayer DCA Hb A1c Waived  Result Value Ref Range   HB A1C (BAYER DCA - WAIVED) 6.7 <7.0 %      Assessment & Plan:   Problem List Items Addressed This Visit      Endocrine   Type 2 diabetes mellitus without obesity ([place]) - Primary    Chronic, stable with A1C today 7%, slight upward trend.  Urine ALB 10.  At this time will maintain current regimen and adjust as needed in future based on A1C level -- may benefit from discontinuation of Januvia in future due to similar method of action to Ozempic and then increase Ozempic to max dose.  Check B12 level due to Metformin use and BMP today.  Recommend he monitor BS at home daily and document for visits.  Return to office in 5 months to meet new PCP.      Relevant Medications   Semaglutide,0.25 or 0.5MG/DOS, (OZEMPIC, 0.25 OR 0.5 MG/DOSE,) 2 MG/1.5ML SOPN   metFORMIN (GLUCOPHAGE) 1000 MG tablet   dapagliflozin propanediol (FARXIGA) 10 MG TABS tablet   sitaGLIPtin (JANUVIA) 100 MG tablet   Other Relevant Orders   Bayer DCA Hb A1c Waived   Basic metabolic panel   Microalbumin, Urine Waived   Hyperlipidemia associated with type 2 diabetes mellitus (HCC)    Chronic, ongoing.  Continue current medication regimen and adjust as needed.  Lipid panel today.         Relevant Medications   Semaglutide,0.25 or 0.5MG/DOS, (OZEMPIC, 0.25 OR 0.5 MG/DOSE,) 2 MG/1.5ML SOPN   metFORMIN (GLUCOPHAGE) 1000 MG tablet   dapagliflozin propanediol (FARXIGA) 10 MG TABS  tablet   sitaGLIPtin (JANUVIA) 100 MG tablet   Other Relevant Orders   Bayer DCA Hb A1c Waived   Lipid Panel w/o Chol/HDL Ratio    Other Visit Diagnoses    B12 deficiency  Check B12 level today, report history of low level.  Is on long term Metformin placing at risk for lower levels.  If low start supplement.   Relevant Orders   Vitamin B12       Follow up plan: Return in about 5 months (around 08/08/2020) for Annual physical and meet new PCP.

## 2020-03-01 NOTE — Assessment & Plan Note (Signed)
Chronic, stable with A1C today 7%, slight upward trend.  Urine ALB 10.  At this time will maintain current regimen and adjust as needed in future based on A1C level -- may benefit from discontinuation of Januvia in future due to similar method of action to Ozempic and then increase Ozempic to max dose.  Check B12 level due to Metformin use and BMP today.  Recommend he monitor BS at home daily and document for visits.  Return to office in 5 months to meet new PCP.

## 2020-03-01 NOTE — Assessment & Plan Note (Signed)
Chronic, ongoing.  Continue current medication regimen and adjust as needed. Lipid panel today. 

## 2020-03-01 NOTE — Patient Instructions (Signed)

## 2020-03-02 LAB — LIPID PANEL W/O CHOL/HDL RATIO
Cholesterol, Total: 139 mg/dL (ref 100–199)
HDL: 50 mg/dL (ref >39)
LDL Chol Calc (NIH): 73 mg/dL (ref 0–99)
Triglycerides: 80 mg/dL (ref 0–149)
VLDL Cholesterol Cal: 16 mg/dL (ref 5–40)

## 2020-03-02 LAB — BASIC METABOLIC PANEL
BUN/Creatinine Ratio: 11 (ref 9–20)
BUN: 11 mg/dL (ref 6–24)
CO2: 22 mmol/L (ref 20–29)
Calcium: 9.7 mg/dL (ref 8.7–10.2)
Chloride: 104 mmol/L (ref 96–106)
Creatinine, Ser: 0.99 mg/dL (ref 0.76–1.27)
GFR calc Af Amer: 106 mL/min/{1.73_m2} (ref >59)
GFR calc non Af Amer: 92 mL/min/{1.73_m2} (ref >59)
Glucose: 114 mg/dL — ABNORMAL HIGH (ref 65–99)
Potassium: 4.4 mmol/L (ref 3.5–5.2)
Sodium: 139 mmol/L (ref 134–144)

## 2020-03-02 LAB — VITAMIN B12: Vitamin B-12: 445 pg/mL (ref 232–1245)

## 2020-03-02 NOTE — Progress Notes (Signed)
Contacted via [place] morning Dashiel, your labs have returned and overall they look great.  Continue all current medications.  Your B12 level is on lower end of normal, we like it > 300.  It may not hurt to take 500 MCG of Vitamin B12 daily, since you are taking Metformin which can lower this level and cause neuropathy type symptoms over time.  Let me know if any questions.  Have a great day!! Keep being awesome!!  Thank you for allowing me to participate in your care. Kindest regards, Paige Vanderwoude

## 2020-04-04 ENCOUNTER — Ambulatory Visit: Payer: Self-pay | Admitting: *Deleted

## 2020-04-04 ENCOUNTER — Other Ambulatory Visit: Payer: Self-pay

## 2020-04-04 ENCOUNTER — Emergency Department
Admission: EM | Admit: 2020-04-04 | Discharge: 2020-04-04 | Disposition: A | Discharge status: Home / Self Care | Payer: BC Managed Care – PPO | Attending: Emergency Medicine | Admitting: Emergency Medicine

## 2020-04-04 ENCOUNTER — Encounter: Payer: Self-pay | Admitting: Emergency Medicine

## 2020-04-04 DIAGNOSIS — Z7984 Long term (current) use of oral hypoglycemic drugs: Secondary | ICD-10-CM | POA: Diagnosis not present

## 2020-04-04 DIAGNOSIS — R42 Dizziness and giddiness: Secondary | ICD-10-CM | POA: Insufficient documentation

## 2020-04-04 DIAGNOSIS — E119 Type 2 diabetes mellitus without complications: Secondary | ICD-10-CM | POA: Diagnosis not present

## 2020-04-04 HISTORY — DX: Type 2 diabetes mellitus without complications: E11.9

## 2020-04-04 LAB — COOXEMETRY PANEL
Carboxyhemoglobin: 0.7 % (ref 0.5–1.5)
Methemoglobin: 0.5 % (ref 0.0–1.5)
O2 Saturation: 52.7 %
Total hemoglobin: 16.3 g/dL — ABNORMAL HIGH (ref 12.0–16.0)
Total oxygen content: 52.7 mL/dL

## 2020-04-04 LAB — CBC
HCT: 45.5 % (ref 39.0–52.0)
Hemoglobin: 15.6 g/dL (ref 13.0–17.0)
MCH: 29.5 pg (ref 26.0–34.0)
MCHC: 34.3 g/dL (ref 30.0–36.0)
MCV: 86 fL (ref 80.0–100.0)
Platelets: 287 10*3/uL (ref 150–400)
RBC: 5.29 MIL/uL (ref 4.22–5.81)
RDW: 12.7 % (ref 11.5–15.5)
WBC: 8.8 10*3/uL (ref 4.0–10.5)
nRBC: 0 % (ref 0.0–0.2)

## 2020-04-04 LAB — BASIC METABOLIC PANEL
Anion gap: 10 (ref 5–15)
BUN: 15 mg/dL (ref 6–20)
CO2: 25 mmol/L (ref 22–32)
Calcium: 9.6 mg/dL (ref 8.9–10.3)
Chloride: 103 mmol/L (ref 98–111)
Creatinine, Ser: 0.9 mg/dL (ref 0.61–1.24)
GFR, Estimated: >60 mL/min (ref >60)
Glucose, Bld: 123 mg/dL — ABNORMAL HIGH (ref 70–99)
Potassium: 4 mmol/L (ref 3.5–5.1)
Sodium: 138 mmol/L (ref 135–145)

## 2020-04-04 MED ORDER — ONDANSETRON 4 MG PO TBDP: 4.0000 mg | ORAL_TABLET | Freq: Four times a day (QID) | ORAL | 0 refills | Status: DC | PRN

## 2020-04-04 MED ORDER — MECLIZINE HCL 25 MG PO TABS: 25.0000 mg | ORAL_TABLET | Freq: Three times a day (TID) | ORAL | 0 refills | Status: DC | PRN
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
## 2020-04-04 NOTE — ED Provider Notes (Signed)
[facility] Emergency Department Provider Note   ____________________________________________   Event Date/Time   First MD Initiated Contact with Patient 04/04/20 2028     (approximate)  I have reviewed the triage vital signs and the nursing notes.   HISTORY  Chief Complaint Dizziness and Emesis    HPI Aaron Wood is a 44 y.o. male   history of diabetes  Patient reports that when he woke up this morning he rolled over to turn off his alarm clock and suddenly had a severe spinning feeling.  Lasted briefly, then when he got up again he sat up and he noticed throughout the morning whenever he moves his head or turns he felt like he would get a spinning sensation.  Patient reports since he came to the ER he is feeling a lot better and the symptoms have all gone away.  He had no headache no facial droop no neck pain.  He reports that no numbness or tingling no trouble speaking.  Feels perfectly fine now has been up walking moving about and all symptoms have gone away  He did work a Youth worker as a Environmental consultant, no direct known heavy smoke exposure but reports he did want to have a carboxyhemoglobin level checked at the advice of his wife  No chest pain or shortness of breath.  Past Medical History:  Diagnosis Date   Diabetes mellitus without complication [facility])     Patient Active Problem List   Diagnosis Date Noted   Type 2 diabetes mellitus without obesity ([place]) 07/11/2017   Hyperlipidemia associated with type 2 diabetes mellitus ([place]) 07/11/2017   OSA (obstructive sleep apnea) 07/11/2017    History reviewed. No pertinent surgical history.  Prior to Admission medications   Medication Sig Start Date End Date Taking? Authorizing Provider  atorvastatin (LIPITOR) 20 MG tablet Take 1 tablet (20 mg total) by mouth daily. 08/07/19   Volney American, PA-C  dapagliflozin propanediol (FARXIGA) 10 MG TABS tablet Take 1 tablet (10 mg  total) by mouth daily. 03/01/20   Cannady, Henrine Screws T, NP  meclizine (ANTIVERT) 25 MG tablet Take 1 tablet (25 mg total) by mouth 3 (three) times daily as needed for dizziness. 04/04/20   Delman Kitten, MD  metFORMIN (GLUCOPHAGE) 1000 MG tablet Take 1 tablet (1,000 mg total) by mouth 2 (two) times daily with a meal. 03/01/20   Cannady, Jolene T, NP  ondansetron (ZOFRAN ODT) 4 MG disintegrating tablet Take 1 tablet (4 mg total) by mouth every 6 (six) hours as needed for nausea or vomiting. 04/04/20   Delman Kitten, MD  Semaglutide,0.25 or 0.5MG/DOS, (OZEMPIC, 0.25 OR 0.5 MG/DOSE,) 2 MG/1.5ML SOPN Inject 0.5 mg into the skin once a week. 03/01/20   Cannady, Henrine Screws T, NP  sildenafil (REVATIO) 20 MG tablet Take 20 mg by mouth as needed. 11/19/19   [provider]  sitaGLIPtin (JANUVIA) 100 MG tablet Take 1 tablet (100 mg total) by mouth daily. 03/01/20   Venita Lick, NP    Allergies Patient has no known allergies.  History reviewed. No pertinent family history.  Social History Social History   Tobacco Use   Smoking status: Never Smoker   Smokeless tobacco: Never Used  Scientific laboratory technician Use: Never used  Substance Use Topics   Alcohol use: Yes    Comment: occasionally   Drug use: Never    Review of Systems Constitutional: No fever/chills Eyes: No visual changes. ENT: No sore throat.  No ear pain no sinus congestion. Cardiovascular: Denies chest pain. Respiratory: Denies shortness of breath. Gastrointestinal: No abdominal pain.   Genitourinary: Negative for dysuria. Musculoskeletal: Negative for back pain. Skin: Negative for rash. Neurological: Negative for headaches, areas of focal weakness or numbness.  Did have a severe spinning sensation would come and go with head movement that has now since gone away.    ____________________________________________   PHYSICAL EXAM:  VITAL SIGNS: ED Triage Vitals  Enc Vitals Group     BP 04/04/20 1534 (!) 118/99      Pulse Rate 04/04/20 1534 (!) 102     Resp 04/04/20 1534 18     Temp 04/04/20 1534 97.8 F (36.6 C)     Temp Source 04/04/20 1534 Oral     SpO2 04/04/20 1534 98 %     Weight 04/04/20 1535 165 lb (74.8 kg)     Height 04/04/20 1535 5' 6"  (1.676 m)     Head Circumference --      Peak Flow --      Pain Score 04/04/20 1535 0     Pain Loc --      Pain Edu? --      Excl. in [place]? --     Constitutional: Alert and oriented. Well appearing and in no acute distress. Eyes: Conjunctivae are normal. Head: Atraumatic. Nose: No congestion/rhinnorhea.  Right tympanic membrane demonstrates small amount of bubbles behind it and just slightly retracted.  Left tympanic membrane normal. Mouth/Throat: Mucous membranes are moist. Neck: No stridor.  Cardiovascular: Normal rate, regular rhythm. Grossly normal heart sounds.  Good peripheral circulation. Respiratory: Normal respiratory effort.  No retractions. Lungs CTAB. Gastrointestinal: Soft and nontender. No distention. Musculoskeletal: No lower extremity tenderness nor edema. Neurologic:  Normal speech and language. No gross focal neurologic deficits are appreciated.  Negative for vertigo with Dix-Hallpike bilateral.  He has normal extraocular movements.  He has normal test of skew.  No direction changing nystagmus.  Of note the patient reports all of his symptoms of dizziness have resolved at the time I performed his examination, he reports he has had several hours now of resolution.  No pronator drift bilateral.  He stands and walks with a normal gait without assistance. Skin:  Skin is warm, dry and intact. No rash noted. Psychiatric: Mood and affect are normal. Speech and behavior are normal.  ____________________________________________   LABS (all labs ordered are listed, but only abnormal results are displayed)  Labs Reviewed  BASIC METABOLIC PANEL - Abnormal; Notable for the following components:      Result Value   Glucose, Bld 123 (*)    All  other components within normal limits  COOXEMETRY PANEL - Abnormal; Notable for the following components:   Total hemoglobin 16.3 (*)    All other components within normal limits  CBC  URINALYSIS, COMPLETE (UACMP) WITH MICROSCOPIC  CBG MONITORING, ED   ____________________________________________  EKG  ED ECG REPORT I, Delman Kitten, the attending physician, personally viewed and interpreted this ECG.  Date: 04/04/2020 EKG Time: 1532 Rate: 98 Rhythm: normal sinus rhythm QRS Axis: normal Intervals: normal ST/T Wave abnormalities: normal Narrative Interpretation: no evidence of acute ischemia  ____________________________________________  RADIOLOGY  I discussed risks and benefits of imaging of the brain including MRI of the brain to "rule out a stroke".  My pretest probability for this and his reassuring exam demonstrate a very low risk for acute stroke.  He has no acute neurologic symptoms to suggest central cause.  I  suspect likely peripheral.  We discussed risks and benefits and that MRI would be helpful to rule out stroke, but given resolution of his symptoms and with shared medical decision making decision is made to forego MRI of the brain and instead treat symptomatically with meclizine or Zofran if needed with careful return precautions and PCP follow-up.  The patient desired this and did not wish to undergo MRI of the brain at this time to exclude central cause.  I think this is reasonable ____________________________________________   PROCEDURES  Procedure(s) performed: None  Procedures  Critical Care performed: No  ____________________________________________   INITIAL IMPRESSION / ASSESSMENT AND PLAN / ED COURSE  Pertinent labs & imaging results that were available during my care of the patient were reviewed by me and considered in my medical decision making (see chart for details).   Patient presents for signs and symptoms of what appear to be vertigo.  Ports  severe spinning sensation with head movements upon awakening this morning and earlier in the day which have now resolved.  No acute or hard neurologic findings associated.  No headache.  No red flags.  Carboxyhemoglobin reassuring.  Reassuring EKG and further work-up in the ER.  Labs reassuring as well.  Return precautions and treatment recommendations and follow-up discussed with the patient who is agreeable with the plan.  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  I did advise him against driving emergency vehicle or working in dangerous equipment or driving until cleared by his physician which he is in agreement with      ____________________________________________   FINAL CLINICAL IMPRESSION(S) / ED DIAGNOSES  Final diagnoses:  Vertigo        Note:  This document was prepared using Dragon voice recognition software and may include unintentional dictation errors       Delman Kitten, MD 04/04/20 2333

## 2020-04-04 NOTE — ED Triage Notes (Signed)
Pt to ER with c/o dizziness described as room spinning.  Pt also reports vomiting with dizziness especially with movement.
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
## 2020-04-04 NOTE — Telephone Encounter (Signed)
Patient currently admitted

## 2020-04-24 ENCOUNTER — Other Ambulatory Visit: Payer: Self-pay | Admitting: Family Medicine

## 2020-07-30 ENCOUNTER — Other Ambulatory Visit: Payer: Self-pay | Admitting: Nurse Practitioner

## 2020-07-30 NOTE — Telephone Encounter (Signed)
Requested Prescriptions  Pending Prescriptions Disp Refills  . atorvastatin (LIPITOR) 20 MG tablet [Pharmacy Med Name: ATORVASTATIN 20 MG TABLET] 90 tablet 1    Sig: TAKE 1 TABLET BY MOUTH EVERY DAY     Cardiovascular:  Antilipid - Statins Passed - 07/30/2020  5:21 PM      Passed - Total Cholesterol in normal range and within 360 days    Cholesterol, Total  Date Value Ref Range Status  03/01/2020 139 100 - 199 mg/dL Final   Cholesterol Piccolo, Waived  Date Value Ref Range Status  08/07/2019 157 <200 mg/dL Final    Comment:                            Desirable                <200                         Borderline High      200- 239                         High                     >239          Passed - LDL in normal range and within 360 days    LDL Chol Calc (NIH)  Date Value Ref Range Status  03/01/2020 73 0 - 99 mg/dL Final         Passed - HDL in normal range and within 360 days    HDL  Date Value Ref Range Status  03/01/2020 50 >39 mg/dL Final         Passed - Triglycerides in normal range and within 360 days    Triglycerides  Date Value Ref Range Status  03/01/2020 80 0 - 149 mg/dL Final   Triglycerides Piccolo,Waived  Date Value Ref Range Status  08/07/2019 106 <150 mg/dL Final    Comment:                            Normal                   <150                         Borderline High     150 - 199                         High                200 - 499                         Very High                >499          Passed - Patient is not pregnant      Passed - Valid encounter within last 12 months    Recent Outpatient Visits          5 months ago Type 2 diabetes mellitus without obesity ([place])   [place], Jolene T, NP   8 months ago Need for hepatitis C screening test   [facility] [place], [place]   11 months ago Hyperlipidemia, unspecified hyperlipidemia type   [place], [place], [place]   2 years ago Type 2 diabetes mellitus without complication, without long-term current use of insulin ([place])   [facility], Jeannette How, MD   2 years ago Type 2 diabetes mellitus without complication, without long-term current use of insulin [facility])   [facility], Jeannette How, MD

## 2020-11-23 ENCOUNTER — Other Ambulatory Visit: Payer: Self-pay | Admitting: Nurse Practitioner

## 2020-11-23 NOTE — Telephone Encounter (Signed)
Requested medication (s) are due for refill today: no  Requested medication (s) are on the active medication list: yes  Last refill:  11/01/2020  Future visit scheduled: no  Notes to clinic:  overdue for follow up appointment Message sent to patient to contact office for appt    Requested Prescriptions  Pending Prescriptions Disp Refills   OZEMPIC, 0.25 OR 0.5 MG/DOSE, 2 MG/1.5ML SOPN [Pharmacy Med Name: OZEMPIC 0.25-0.5 MG/DOSE PEN]  4    Sig: Inject 0.5 mg into the skin once a week.      Endocrinology:  Diabetes - GLP-1 Receptor Agonists Failed - 11/23/2020  9:30 AM      Failed - HBA1C is between 0 and 7.9 and within 180 days    HB A1C (BAYER DCA - WAIVED)  Date Value Ref Range Status  03/01/2020 7.0 (H) <7.0 % Final    Comment:                                          Diabetic Adult            <7.0                                       Healthy Adult        4.3 - 5.7                                                           (DCCT/NGSP) American Diabetes Association's Summary of Glycemic Recommendations for Adults with Diabetes: Hemoglobin A1c <7.0%. More stringent glycemic goals (A1c <6.0%) may further reduce complications at the cost of increased risk of hypoglycemia.           Failed - Valid encounter within last 6 months    Recent Outpatient Visits           8 months ago Type 2 diabetes mellitus without obesity ([place])   [place], [place] T, NP   11 months ago Need for hepatitis C screening test   [facility], [place]   1 year ago Hyperlipidemia, unspecified hyperlipidemia type   [facility], Lilia Argue, [place]   2 years ago Type 2 diabetes mellitus without complication, without long-term current use of insulin ([place])   [facility], Jeannette How, MD   2 years ago Type 2 diabetes mellitus without complication, without long-term current use of insulin [facility])   [facility], Jeannette How, MD

## 2020-12-09 NOTE — Progress Notes (Signed)
BP 135/82   Pulse 80   Temp 97.8 F (36.6 C)   Ht 5' 7.01" (1.702 m)   Wt 168 lb 4 oz (76.3 kg)   SpO2 98%   BMI 26.35 kg/m    Subjective:    Patient ID: Aaron Wood, male    DOB: 1975/10/21, 45 y.o.   MRN: [id number]  HPI: Aaron Wood is a 45 y.o. male  Chief Complaint  Patient presents with   Diabetes   HYPERLIPIDEMIA Hyperlipidemia status: excellent compliance Satisfied with current treatment?  no Side effects:  yes Medication compliance: excellent compliance Past cholesterol meds: atorvastain (lipitor) Supplements: none Aspirin:  no The 10-year ASCVD risk score Mikey Bussing DC Jr., et al., 2013) is: 2.6%   Values used to calculate the score:     Age: 45 years     Sex: Male     Is Non-Hispanic African American: No     Diabetic: Yes     Tobacco smoker: No     Systolic Blood Pressure: 828 mmHg     Is BP treated: No     HDL Cholesterol: 50 mg/dL     Total Cholesterol: 139 mg/dL Chest pain:  no Coronary artery disease:  no Family history CAD:  no Family history early CAD:  no  DIABETES Hypoglycemic episodes:no Polydipsia/polyuria: no Visual disturbance: no Chest pain: no Paresthesias: no Glucose Monitoring: no  Accucheck frequency: Not Checking  Fasting glucose:  Post prandial:  Evening:  Before meals: Taking Insulin?: no  Long acting insulin:  Short acting insulin: Blood Pressure Monitoring: not checking Retinal Examination: Not up to Date Foot Exam: Up to Date Diabetic Education: Not Completed Pneumovax: Not up to Date Influenza: unknown Aspirin: no   Denies HA, CP, SOB, dizziness, palpitations, visual changes, and lower extremity swelling.  Relevant past medical, surgical, family and social history reviewed and updated as indicated. Interim medical history since our last visit reviewed. Allergies and medications reviewed and updated.  Review of Systems  Eyes:  Negative for visual disturbance.  Respiratory:  Negative for chest tightness  and shortness of breath.   Cardiovascular:  Negative for chest pain, palpitations and leg swelling.  Endocrine: Negative for polydipsia and polyuria.  Neurological:  Negative for dizziness, light-headedness, numbness and headaches.   Per HPI unless specifically indicated above     Objective:    BP 135/82   Pulse 80   Temp 97.8 F (36.6 C)   Ht 5' 7.01" (1.702 m)   Wt 168 lb 4 oz (76.3 kg)   SpO2 98%   BMI 26.35 kg/m   Wt Readings from Last 3 Encounters:  12/12/20 168 lb 4 oz (76.3 kg)  04/04/20 165 lb (74.8 kg)  11/30/19 165 lb (74.8 kg)    Physical Exam Vitals and nursing note reviewed.  Constitutional:      General: He is not in acute distress.    Appearance: Normal appearance. He is not ill-appearing, toxic-appearing or diaphoretic.  HENT:     Head: Normocephalic.     Right Ear: External ear normal.     Left Ear: External ear normal.     Nose: Nose normal. No congestion or rhinorrhea.     Mouth/Throat:     Mouth: Mucous membranes are moist.  Eyes:     General:        Right eye: No discharge.        Left eye: No discharge.     Extraocular Movements: Extraocular movements intact.  Conjunctiva/sclera: Conjunctivae normal.     Pupils: Pupils are equal, round, and reactive to light.  Cardiovascular:     Rate and Rhythm: Normal rate and regular rhythm.     Heart sounds: No murmur heard. Pulmonary:     Effort: Pulmonary effort is normal. No respiratory distress.     Breath sounds: Normal breath sounds. No wheezing, rhonchi or rales.  Abdominal:     General: Abdomen is flat. Bowel sounds are normal.  Musculoskeletal:     Cervical back: Normal range of motion and neck supple.  Skin:    General: Skin is warm and dry.     Capillary Refill: Capillary refill takes less than 2 seconds.  Neurological:     General: No focal deficit present.     Mental Status: He is alert and oriented to person, place, and time.  Psychiatric:        Mood and Affect: Mood normal.         Behavior: Behavior normal.        Thought Content: Thought content normal.        Judgment: Judgment normal.    Results for orders placed or performed during the hospital encounter of 27/03/50  Basic metabolic panel  Result Value Ref Range   Sodium 138 135 - 145 mmol/L   Potassium 4.0 3.5 - 5.1 mmol/L   Chloride 103 98 - 111 mmol/L   CO2 25 22 - 32 mmol/L   Glucose, Bld 123 (H) 70 - 99 mg/dL   BUN 15 6 - 20 mg/dL   Creatinine, Ser 0.90 0.61 - 1.24 mg/dL   Calcium 9.6 8.9 - 10.3 mg/dL   GFR, Estimated >60 >60 mL/min   Anion gap 10 5 - 15  CBC  Result Value Ref Range   WBC 8.8 4.0 - 10.5 K/uL   RBC 5.29 4.22 - 5.81 MIL/uL   Hemoglobin 15.6 13.0 - 17.0 g/dL   HCT 45.5 39.0 - 52.0 %   MCV 86.0 80.0 - 100.0 fL   MCH 29.5 26.0 - 34.0 pg   MCHC 34.3 30.0 - 36.0 g/dL   RDW 12.7 11.5 - 15.5 %   Platelets 287 150 - 400 K/uL   nRBC 0.0 0.0 - 0.2 %  Cooxemetry Panel, hospital-performed (carboxy, met, total hgb, O2 sat)  Result Value Ref Range   Total hemoglobin 16.3 (H) 12.0 - 16.0 g/dL   O2 Saturation 52.7 %   Carboxyhemoglobin 0.7 0.5 - 1.5 %   Methemoglobin 0.5 0.0 - 1.5 %   Total oxygen content 52.7 mL/dL      Assessment & Plan:   Problem List Items Addressed This Visit       Endocrine   Type 2 diabetes mellitus without obesity (HCC)    Chronic.  Controlled last A1c in November was 7%.  Continue with current medication regimen.  Labs ordered today.  Refills set today.  Return to clinic in 3 months for reevaluation.  Call sooner if concerns arise.        Relevant Medications   atorvastatin (LIPITOR) 20 MG tablet   Semaglutide,0.25 or 0.5MG/DOS, (OZEMPIC, 0.25 OR 0.5 MG/DOSE,) 2 MG/1.5ML SOPN   Other Relevant Orders   Comp Met (CMET)   HgB A1c   Hyperlipidemia associated with type 2 diabetes mellitus ([place]) - Primary    Chronic.  Controlled.  Continue with current medication regimen of Atorvastatin 80m.  Labs ordered today.  Return to clinic in 3 months for  reevaluation.  Call sooner  if concerns arise.        Relevant Medications   atorvastatin (LIPITOR) 20 MG tablet   Semaglutide,0.25 or 0.5MG/DOS, (OZEMPIC, 0.25 OR 0.5 MG/DOSE,) 2 MG/1.5ML SOPN   Other Relevant Orders   Comp Met (CMET)   Lipid Profile     Follow up plan: Return in about 3 months (around 03/14/2021) for Physical and Fasting labs.

## 2020-12-12 ENCOUNTER — Ambulatory Visit: Payer: BC Managed Care – PPO | Admitting: Nurse Practitioner

## 2020-12-12 ENCOUNTER — Encounter: Payer: Self-pay | Admitting: Nurse Practitioner

## 2020-12-12 ENCOUNTER — Other Ambulatory Visit: Payer: Self-pay

## 2020-12-12 VITALS — BP 135/82 | HR 80 | Temp 97.8°F | Ht 67.01 in | Wt 168.2 lb

## 2020-12-12 DIAGNOSIS — E1169 Type 2 diabetes mellitus with other specified complication: Secondary | ICD-10-CM

## 2020-12-12 DIAGNOSIS — E785 Hyperlipidemia, unspecified: Secondary | ICD-10-CM

## 2020-12-12 DIAGNOSIS — E119 Type 2 diabetes mellitus without complications: Secondary | ICD-10-CM | POA: Diagnosis not present

## 2020-12-12 MED ORDER — ATORVASTATIN CALCIUM 20 MG PO TABS: 20.0000 mg | ORAL_TABLET | Freq: Every day | ORAL | 1 refills | Status: DC

## 2020-12-12 MED ORDER — OZEMPIC (0.25 OR 0.5 MG/DOSE) 2 MG/1.5ML ~~LOC~~ SOPN: 0.5000 mg | PEN_INJECTOR | SUBCUTANEOUS | 1 refills | Status: DC

## 2020-12-12 NOTE — Assessment & Plan Note (Addendum)
Chronic.  Controlled.  Continue with current medication regimen of Atorvastatin 35m.  Labs ordered today.  Return to clinic in 3 months for reevaluation.  Call sooner if concerns arise.

## 2020-12-12 NOTE — Assessment & Plan Note (Signed)
Chronic.  Controlled last A1c in November was 7%.  Continue with current medication regimen.  Labs ordered today.  Refills set today.  Return to clinic in 3 months for reevaluation.  Call sooner if concerns arise.

## 2020-12-13 LAB — COMPREHENSIVE METABOLIC PANEL
ALT: 10 IU/L (ref 0–44)
AST: 10 IU/L (ref 0–40)
Albumin/Globulin Ratio: 1.5 (ref 1.2–2.2)
Albumin: 4.3 g/dL (ref 4.0–5.0)
Alkaline Phosphatase: 73 IU/L (ref 44–121)
BUN/Creatinine Ratio: 11 (ref 9–20)
BUN: 12 mg/dL (ref 6–24)
Bilirubin Total: 0.5 mg/dL (ref 0.0–1.2)
CO2: 22 mmol/L (ref 20–29)
Calcium: 9.9 mg/dL (ref 8.7–10.2)
Chloride: 101 mmol/L (ref 96–106)
Creatinine, Ser: 1.08 mg/dL (ref 0.76–1.27)
Globulin, Total: 2.8 g/dL (ref 1.5–4.5)
Glucose: 107 mg/dL — ABNORMAL HIGH (ref 65–99)
Potassium: 5.3 mmol/L — ABNORMAL HIGH (ref 3.5–5.2)
Sodium: 135 mmol/L (ref 134–144)
Total Protein: 7.1 g/dL (ref 6.0–8.5)
eGFR: 86 mL/min/{1.73_m2} (ref >59)

## 2020-12-13 LAB — HEMOGLOBIN A1C
Est. average glucose Bld gHb Est-mCnc: 186 mg/dL
Hgb A1c MFr Bld: 8.1 % — ABNORMAL HIGH (ref 4.8–5.6)

## 2020-12-13 LAB — LIPID PANEL
Chol/HDL Ratio: 3.2 ratio (ref 0.0–5.0)
Cholesterol, Total: 131 mg/dL (ref 100–199)
HDL: 41 mg/dL (ref >39)
LDL Chol Calc (NIH): 73 mg/dL (ref 0–99)
Triglycerides: 89 mg/dL (ref 0–149)
VLDL Cholesterol Cal: 17 mg/dL (ref 5–40)

## 2020-12-13 MED ORDER — SEMAGLUTIDE (1 MG/DOSE) 4 MG/3ML ~~LOC~~ SOPN: 1.0000 mg | PEN_INJECTOR | SUBCUTANEOUS | 1 refills | Status: DC

## 2020-12-13 NOTE — Progress Notes (Signed)
Hi Aaron Wood.  Your lab work shows that your A1c increased to 8.1.  I will go ahead and send in the Ozempic 34m.  We will follow up in 3 months for reevaluation. Otherwise your lab work looks good.  Recommend decreasing the Carbohydrates in your diet.

## 2020-12-13 NOTE — Addendum Note (Signed)
Addended by: Jon Billings on: 12/13/2020 08:29 AM   Modules accepted: Orders

## 2021-03-14 ENCOUNTER — Encounter: Payer: Self-pay | Admitting: Nurse Practitioner

## 2021-03-14 ENCOUNTER — Other Ambulatory Visit: Payer: Self-pay

## 2021-03-14 ENCOUNTER — Ambulatory Visit (INDEPENDENT_AMBULATORY_CARE_PROVIDER_SITE_OTHER): Payer: BC Managed Care – PPO | Admitting: Nurse Practitioner

## 2021-03-14 VITALS — BP 121/77 | HR 94 | Temp 97.7°F | Ht 67.0 in | Wt 166.2 lb

## 2021-03-14 DIAGNOSIS — Z0000 Encounter for general adult medical examination without abnormal findings: Secondary | ICD-10-CM

## 2021-03-14 DIAGNOSIS — E785 Hyperlipidemia, unspecified: Secondary | ICD-10-CM | POA: Diagnosis not present

## 2021-03-14 DIAGNOSIS — E119 Type 2 diabetes mellitus without complications: Secondary | ICD-10-CM | POA: Diagnosis not present

## 2021-03-14 DIAGNOSIS — Z23 Encounter for immunization: Secondary | ICD-10-CM | POA: Diagnosis not present

## 2021-03-14 DIAGNOSIS — E1169 Type 2 diabetes mellitus with other specified complication: Secondary | ICD-10-CM | POA: Diagnosis not present

## 2021-03-14 DIAGNOSIS — Z1211 Encounter for screening for malignant neoplasm of colon: Secondary | ICD-10-CM

## 2021-03-14 DIAGNOSIS — E1129 Type 2 diabetes mellitus with other diabetic kidney complication: Secondary | ICD-10-CM | POA: Diagnosis not present

## 2021-03-14 DIAGNOSIS — Z1231 Encounter for screening mammogram for malignant neoplasm of breast: Secondary | ICD-10-CM

## 2021-03-14 DIAGNOSIS — N529 Male erectile dysfunction, unspecified: Secondary | ICD-10-CM

## 2021-03-14 LAB — URINALYSIS, ROUTINE W REFLEX MICROSCOPIC
Bilirubin, UA: NEGATIVE
Leukocytes,UA: NEGATIVE
Nitrite, UA: NEGATIVE
Protein,UA: NEGATIVE
Specific Gravity, UA: 1.015 (ref 1.005–1.030)
Urobilinogen, Ur: 0.2 mg/dL (ref 0.2–1.0)
pH, UA: 5.5 (ref 5.0–7.5)

## 2021-03-14 LAB — MICROSCOPIC EXAMINATION
Bacteria, UA: NONE SEEN
Epithelial Cells (non renal): NONE SEEN /hpf (ref 0–10)
RBC, Urine: NONE SEEN /hpf (ref 0–2)
WBC, UA: NONE SEEN /hpf (ref 0–5)

## 2021-03-14 MED ORDER — SILDENAFIL CITRATE 50 MG PO TABS: 50.0000 mg | ORAL_TABLET | Freq: Every day | ORAL | 3 refills | Status: DC | PRN

## 2021-03-14 MED ORDER — DAPAGLIFLOZIN PROPANEDIOL 10 MG PO TABS: 10.0000 mg | ORAL_TABLET | Freq: Every day | ORAL | 1 refills | Status: DC

## 2021-03-14 MED ORDER — METFORMIN HCL 1000 MG PO TABS: 1000.0000 mg | ORAL_TABLET | Freq: Two times a day (BID) | ORAL | 1 refills | Status: DC

## 2021-03-14 MED ORDER — SEMAGLUTIDE (1 MG/DOSE) 4 MG/3ML ~~LOC~~ SOPN: 1.0000 mg | PEN_INJECTOR | SUBCUTANEOUS | 1 refills | Status: DC

## 2021-03-14 NOTE — Progress Notes (Signed)
BP 121/77   Pulse 94   Temp 97.7 F (36.5 C) (Oral)   Ht 5' 7"  (1.702 m)   Wt 166 lb 4 oz (75.4 kg)   SpO2 98%   BMI 26.04 kg/m    Subjective:    Patient ID: Aaron Wood, male    DOB: 1975/10/15, 45 y.o.   MRN: [id number]  HPI: TOD ABRAHAMSEN is a 45 y.o. male presenting on 03/14/2021 for comprehensive medical examination. Current medical complaints include:none  He currently lives with: Interim Problems from his last visit: no  DIABETES Hypoglycemic episodes:no Polydipsia/polyuria: no Visual disturbance: no Chest pain: no Paresthesias: no Glucose Monitoring: no  Accucheck frequency: Not Checking  Fasting glucose:  Post prandial:  Evening:  Before meals: Taking Insulin?: no  Long acting insulin:  Short acting insulin: Blood Pressure Monitoring: not checking Retinal Examination:  Discussed at visit.  Needs to schedule Foot Exam:  Done Today Diabetic Education: Not Completed Pneumovax:  Refused Influenza:  Given today Aspirin: no  HYPERLIPIDEMIA Hyperlipidemia status: excellent compliance Satisfied with current treatment?  no Side effects:  no Medication compliance: excellent compliance Past cholesterol meds: atorvastain (lipitor) Supplements: none Aspirin:  no The 10-year ASCVD risk score (Arnett DK, et al., 2019) is: 2.5%   Values used to calculate the score:     Age: 52 years     Sex: Male     Is Non-Hispanic African American: No     Diabetic: Yes     Tobacco smoker: No     Systolic Blood Pressure: 976 mmHg     Is BP treated: No     HDL Cholesterol: 41 mg/dL     Total Cholesterol: 131 mg/dL Chest pain:  no Coronary artery disease:  no Family history CAD:  no Family history early CAD:  no  ED Patient states that he is having a problem with ED.  Wondering if it is related to medications or age.   Depression Screen done today and results listed below:  Depression screen [facility] 2/9 03/14/2021 03/14/2021 12/12/2020 08/07/2019 02/20/2018  Decreased  Interest 0 0 0 0 0  Down, Depressed, Hopeless 0 0 0 0 0  PHQ - 2 Score 0 0 0 0 0  Altered sleeping 1 - - 0 0  Tired, decreased energy 0 - - 1 0  Change in appetite 0 - - 0 0  Feeling bad or failure about yourself  0 - - 0 0  Trouble concentrating 0 - - 0 0  Moving slowly or fidgety/restless 0 - - 0 0  Suicidal thoughts 0 - - 0 0  PHQ-9 Score 1 - - 1 0  Difficult doing work/chores Not difficult at all - - - Not difficult at all    The patient does not have a history of falls. I did complete a risk assessment for falls. A plan of care for falls was documented.   Past Medical History:  Past Medical History:  Diagnosis Date   Diabetes mellitus without complication ([place])     Surgical History:  History reviewed. No pertinent surgical history.  Medications:  Current Outpatient Medications on File Prior to Visit  Medication Sig   atorvastatin (LIPITOR) 20 MG tablet Take 1 tablet (20 mg total) by mouth daily.   No current facility-administered medications on file prior to visit.    Allergies:  No Known Allergies  Social History:  Social History   Socioeconomic History   Marital status: Married    Spouse name: Not  on file   Number of children: Not on file   Years of education: Not on file   Highest education level: Not on file  Occupational History   Not on file  Tobacco Use   Smoking status: Never   Smokeless tobacco: Never  Vaping Use   Vaping Use: Never used  Substance and Sexual Activity   Alcohol use: Yes    Comment: occasionally   Drug use: Never   Sexual activity: Not on file  Other Topics Concern   Not on file  Social History Narrative   Not on file   Social Determinants of Health   Financial Resource Strain: Not on file  Food Insecurity: Not on file  Transportation Needs: Not on file  Physical Activity: Not on file  Stress: Not on file  Social Connections: Not on file  Intimate Partner Violence: Not on file   Social History   Tobacco Use   Smoking Status Never  Smokeless Tobacco Never   Social History   Substance and Sexual Activity  Alcohol Use Yes   Comment: occasionally    Family History:  History reviewed. No pertinent family history.  Past medical history, surgical history, medications, allergies, family history and social history reviewed with patient today and changes made to appropriate areas of the chart.   Review of Systems  HENT:         Denies vision changes.  Eyes:  Negative for blurred vision and double vision.  Respiratory:  Negative for shortness of breath.   Cardiovascular:  Negative for chest pain, palpitations and leg swelling.  Neurological:  Negative for dizziness, tingling and headaches.  Endo/Heme/Allergies:  Negative for polydipsia.       Denies Polyuria  All other ROS negative except what is listed above and in the HPI.      Objective:    BP 121/77   Pulse 94   Temp 97.7 F (36.5 C) (Oral)   Ht 5' 7"  (1.702 m)   Wt 166 lb 4 oz (75.4 kg)   SpO2 98%   BMI 26.04 kg/m   Wt Readings from Last 3 Encounters:  03/14/21 166 lb 4 oz (75.4 kg)  12/12/20 168 lb 4 oz (76.3 kg)  04/04/20 165 lb (74.8 kg)    Physical Exam Vitals and nursing note reviewed.  Constitutional:      General: He is not in acute distress.    Appearance: Normal appearance. He is not ill-appearing, toxic-appearing or diaphoretic.  HENT:     Head: Normocephalic.     Right Ear: Tympanic membrane, ear canal and external ear normal.     Left Ear: Tympanic membrane, ear canal and external ear normal.     Nose: Nose normal. No congestion or rhinorrhea.     Mouth/Throat:     Mouth: Mucous membranes are moist.  Eyes:     General:        Right eye: No discharge.        Left eye: No discharge.     Extraocular Movements: Extraocular movements intact.     Conjunctiva/sclera: Conjunctivae normal.     Pupils: Pupils are equal, round, and reactive to light.  Cardiovascular:     Rate and Rhythm: Normal rate and regular  rhythm.     Heart sounds: No murmur heard. Pulmonary:     Effort: Pulmonary effort is normal. No respiratory distress.     Breath sounds: Normal breath sounds. No wheezing, rhonchi or rales.  Abdominal:  General: Abdomen is flat. Bowel sounds are normal. There is no distension.     Palpations: Abdomen is soft.     Tenderness: There is no abdominal tenderness. There is no guarding.  Musculoskeletal:     Cervical back: Normal range of motion and neck supple.  Skin:    General: Skin is warm and dry.     Capillary Refill: Capillary refill takes less than 2 seconds.  Neurological:     General: No focal deficit present.     Mental Status: He is alert and oriented to person, place, and time.     Cranial Nerves: No cranial nerve deficit.     Motor: No weakness.     Deep Tendon Reflexes: Reflexes normal.  Psychiatric:        Mood and Affect: Mood normal.        Behavior: Behavior normal.        Thought Content: Thought content normal.        Judgment: Judgment normal.    Results for orders placed or performed in visit on 12/12/20  Comp Met (CMET)  Result Value Ref Range   Glucose 107 (H) 65 - 99 mg/dL   BUN 12 6 - 24 mg/dL   Creatinine, Ser 1.08 0.76 - 1.27 mg/dL   eGFR 86 >59 mL/min/1.73   BUN/Creatinine Ratio 11 9 - 20   Sodium 135 134 - 144 mmol/L   Potassium 5.3 (H) 3.5 - 5.2 mmol/L   Chloride 101 96 - 106 mmol/L   CO2 22 20 - 29 mmol/L   Calcium 9.9 8.7 - 10.2 mg/dL   Total Protein 7.1 6.0 - 8.5 g/dL   Albumin 4.3 4.0 - 5.0 g/dL   Globulin, Total 2.8 1.5 - 4.5 g/dL   Albumin/Globulin Ratio 1.5 1.2 - 2.2   Bilirubin Total 0.5 0.0 - 1.2 mg/dL   Alkaline Phosphatase 73 44 - 121 IU/L   AST 10 0 - 40 IU/L   ALT 10 0 - 44 IU/L  Lipid Profile  Result Value Ref Range   Cholesterol, Total 131 100 - 199 mg/dL   Triglycerides 89 0 - 149 mg/dL   HDL 41 >39 mg/dL   VLDL Cholesterol Cal 17 5 - 40 mg/dL   LDL Chol Calc (NIH) 73 0 - 99 mg/dL   Chol/HDL Ratio 3.2 0.0 - 5.0 ratio   HgB A1c  Result Value Ref Range   Hgb A1c MFr Bld 8.1 (H) 4.8 - 5.6 %   Est. average glucose Bld gHb Est-mCnc 186 mg/dL      Assessment & Plan:   Problem List Items Addressed This Visit       Endocrine   Type 2 diabetes mellitus without obesity (HCC)    Chronic.  Controlled.  Continue with current medication regimen on Farxiga 75m, Ozempic 134mand Metformin 100061mID.  Refills sent today.  Labs ordered today.  Return to clinic in 6 months for reevaluation.  Call sooner if concerns arise.        Relevant Medications   dapagliflozin propanediol (FARXIGA) 10 MG TABS tablet   Semaglutide, 1 MG/DOSE, 4 MG/3ML SOPN   metFORMIN (GLUCOPHAGE) 1000 MG tablet   Other Relevant Orders   Microalbumin, Urine Waived   HgB A1c   Hyperlipidemia associated with type 2 diabetes mellitus (HCC)    Chronic.  Controlled.  Continue with current medication regimen on Atorvastatin 6m72mily.  Labs ordered today.  Return to clinic in 6 months for reevaluation.  Call sooner  if concerns arise.        Relevant Medications   dapagliflozin propanediol (FARXIGA) 10 MG TABS tablet   Semaglutide, 1 MG/DOSE, 4 MG/3ML SOPN   metFORMIN (GLUCOPHAGE) 1000 MG tablet   sildenafil (VIAGRA) 50 MG tablet   Other Relevant Orders   Lipid panel   Other Visit Diagnoses     Annual physical exam    -  Primary   Health maintenance reviewed during visit. Refused Pnem[place] shot. Labs ordered. Cologuard ordered. Flu shot given.   Relevant Orders   Microalbumin, Urine Waived   TSH   Lipid panel   CBC with Differential/Platelet   Comprehensive metabolic panel   Urinalysis, Routine w reflex microscopic   Erectile dysfunction, unspecified erectile dysfunction type       Sildenafil sent to the pharmacy for patient during visit.  Discussed side effects and benefits of medication. Discussed how to properly use medication.    Screening for colon cancer       Relevant Orders   Cologuard   Screening mammogram for breast  cancer       Need for influenza vaccination       Relevant Orders   Flu Vaccine QUAD 6+ mos PF IM (Fluarix Quad PF)        Discussed aspirin prophylaxis for myocardial infarction prevention and decision was it was not indicated  LABORATORY TESTING:  Health maintenance labs ordered today as discussed above.     IMMUNIZATIONS:   - Tdap: Tetanus vaccination status reviewed: last tetanus booster within 10 years. - Influenza: Administered today - Pneumovax: Refused - Prevnar: Not applicable - COVID: Up to date - HPV: Not applicable - Shingrix vaccine: Not applicable  SCREENING: - Colonoscopy:  Cologuard ordered today   Discussed with patient purpose of the colonoscopy is to detect colon cancer at curable precancerous or early stages   - AAA Screening: Not applicable  -Hearing Test: Not applicable  -Spirometry: Not applicable   PATIENT COUNSELING:    Sexuality: Discussed sexually transmitted diseases, partner selection, use of condoms, avoidance of unintended pregnancy  and contraceptive alternatives.   Advised to avoid cigarette smoking.  I discussed with the patient that most people either abstain from alcohol or drink within safe limits (<=14/week and <=4 drinks/occasion for males, <=7/weeks and <= 3 drinks/occasion for females) and that the risk for alcohol disorders and other health effects rises proportionally with the number of drinks per week and how often a drinker exceeds daily limits.  Discussed cessation/primary prevention of drug use and availability of treatment for abuse.   Diet: Encouraged to adjust caloric intake to maintain  or achieve ideal body weight, to reduce intake of dietary saturated fat and total fat, to limit sodium intake by avoiding high sodium foods and not adding table salt, and to maintain adequate dietary potassium and calcium preferably from fresh fruits, vegetables, and low-fat dairy products.    stressed the importance of regular  exercise  Injury prevention: Discussed safety belts, safety helmets, smoke detector, smoking near bedding or upholstery.   Dental health: Discussed importance of regular tooth brushing, flossing, and dental visits.   Follow up plan: NEXT PREVENTATIVE PHYSICAL DUE IN 1 YEAR. Return in about 6 months (around 09/11/2021) for HTN, HLD, DM2 FU.

## 2021-03-14 NOTE — Assessment & Plan Note (Signed)
Chronic.  Controlled.  Continue with current medication regimen on Farxiga 14m, Ozempic 171mand Metformin 100063mID.  Refills sent today.  Labs ordered today.  Return to clinic in 6 months for reevaluation.  Call sooner if concerns arise.

## 2021-03-14 NOTE — Assessment & Plan Note (Signed)
Chronic.  Controlled.  Continue with current medication regimen on Atorvastatin 59m daily.  Labs ordered today.  Return to clinic in 6 months for reevaluation.  Call sooner if concerns arise.

## 2021-03-15 LAB — LIPID PANEL
Chol/HDL Ratio: 3.2 ratio (ref 0.0–5.0)
Cholesterol, Total: 126 mg/dL (ref 100–199)
HDL: 40 mg/dL (ref >39)
LDL Chol Calc (NIH): 69 mg/dL (ref 0–99)
Triglycerides: 90 mg/dL (ref 0–149)
VLDL Cholesterol Cal: 17 mg/dL (ref 5–40)

## 2021-03-15 LAB — COMPREHENSIVE METABOLIC PANEL
ALT: 18 IU/L (ref 0–44)
AST: 15 IU/L (ref 0–40)
Albumin/Globulin Ratio: 1.8 (ref 1.2–2.2)
Albumin: 4.6 g/dL (ref 4.0–5.0)
Alkaline Phosphatase: 77 IU/L (ref 44–121)
BUN/Creatinine Ratio: 16 (ref 9–20)
BUN: 15 mg/dL (ref 6–24)
Bilirubin Total: 0.3 mg/dL (ref 0.0–1.2)
CO2: 24 mmol/L (ref 20–29)
Calcium: 9.7 mg/dL (ref 8.7–10.2)
Chloride: 100 mmol/L (ref 96–106)
Creatinine, Ser: 0.94 mg/dL (ref 0.76–1.27)
Globulin, Total: 2.5 g/dL (ref 1.5–4.5)
Glucose: 118 mg/dL — ABNORMAL HIGH (ref 70–99)
Potassium: 5.1 mmol/L (ref 3.5–5.2)
Sodium: 135 mmol/L (ref 134–144)
Total Protein: 7.1 g/dL (ref 6.0–8.5)
eGFR: 101 mL/min/{1.73_m2} (ref >59)

## 2021-03-15 LAB — CBC WITH DIFFERENTIAL/PLATELET
Basophils Absolute: 0.1 10*3/uL (ref 0.0–0.2)
Basos: 1 %
EOS (ABSOLUTE): 0.3 10*3/uL (ref 0.0–0.4)
Eos: 4 %
Hematocrit: 47.8 % (ref 37.5–51.0)
Hemoglobin: 15.7 g/dL (ref 13.0–17.7)
Immature Grans (Abs): 0 10*3/uL (ref 0.0–0.1)
Immature Granulocytes: 0 %
Lymphocytes Absolute: 1.5 10*3/uL (ref 0.7–3.1)
Lymphs: 20 %
MCH: 28.5 pg (ref 26.6–33.0)
MCHC: 32.8 g/dL (ref 31.5–35.7)
MCV: 87 fL (ref 79–97)
Monocytes Absolute: 0.7 10*3/uL (ref 0.1–0.9)
Monocytes: 9 %
Neutrophils Absolute: 5 10*3/uL (ref 1.4–7.0)
Neutrophils: 66 %
Platelets: 295 10*3/uL (ref 150–450)
RBC: 5.5 x10E6/uL (ref 4.14–5.80)
RDW: 13.1 % (ref 11.6–15.4)
WBC: 7.6 10*3/uL (ref 3.4–10.8)

## 2021-03-15 LAB — HEMOGLOBIN A1C
Est. average glucose Bld gHb Est-mCnc: 171 mg/dL
Hgb A1c MFr Bld: 7.6 % — ABNORMAL HIGH (ref 4.8–5.6)

## 2021-03-15 LAB — TSH: TSH: 1.67 u[IU]/mL (ref 0.450–4.500)

## 2021-03-15 NOTE — Progress Notes (Signed)
Hi Aaron Wood. It was great to see you yesterday. Your lab work looks good.  A1c improved to 7.6 which is great.  Otherwise, your blood work looks great.  Keep up the good work.  Please let me know if you have any questions.

## 2021-03-25 DIAGNOSIS — Z1211 Encounter for screening for malignant neoplasm of colon: Secondary | ICD-10-CM | POA: Diagnosis not present

## 2021-04-01 ENCOUNTER — Other Ambulatory Visit: Payer: Self-pay | Admitting: Nurse Practitioner

## 2021-04-02 NOTE — Telephone Encounter (Signed)
dc'd 03/14/21 Completed course Lysle Dingwall NP Requested Prescriptions  Refused Prescriptions Disp Refills   JANUVIA 100 MG tablet [Pharmacy Med Name: JANUVIA 100 MG TABLET] 90 tablet 4    Sig: TAKE 1 TABLET BY MOUTH EVERY DAY     Endocrinology:  Diabetes - DPP-4 Inhibitors Passed - 04/01/2021  1:14 AM      Passed - HBA1C is between 0 and 7.9 and within 180 days    HB A1C (BAYER DCA - WAIVED)  Date Value Ref Range Status  03/01/2020 7.0 (H) <7.0 % Final    Comment:                                          Diabetic Adult            <7.0                                       Healthy Adult        4.3 - 5.7                                                           (DCCT/NGSP) American Diabetes Association's Summary of Glycemic Recommendations for Adults with Diabetes: Hemoglobin A1c <7.0%. More stringent glycemic goals (A1c <6.0%) may further reduce complications at the cost of increased risk of hypoglycemia.    Hgb A1c MFr Bld  Date Value Ref Range Status  03/14/2021 7.6 (H) 4.8 - 5.6 % Final    Comment:             Prediabetes: 5.7 - 6.4          Diabetes: >6.4          Glycemic control for adults with diabetes: <7.0          Passed - Cr in normal range and within 360 days    Creatinine, Ser  Date Value Ref Range Status  03/14/2021 0.94 0.76 - 1.27 mg/dL Final         Passed - Valid encounter within last 6 months    Recent Outpatient Visits          2 weeks ago Annual physical exam   [facility] Jon Billings, NP   3 months ago Hyperlipidemia associated with type 2 diabetes mellitus ([place])   [facility] Jon Billings, NP   1 year ago Type 2 diabetes mellitus without obesity ([place])   [place], Henrine Screws T, NP   1 year ago Need for hepatitis C screening test   [facility] Volney American, [place]   1 year ago Hyperlipidemia, unspecified hyperlipidemia type   [facility], Lilia Argue, [place]      Future Appointments            In 5 months Jon Billings, NP [facility] [place], [place]

## 2021-04-06 LAB — COLOGUARD: COLOGUARD: NEGATIVE

## 2021-04-06 NOTE — Progress Notes (Signed)
Hi Aaron Wood. Your Cologuard is negative.  We will repeat it in 3 years.

## 2021-05-18 ENCOUNTER — Encounter: Payer: Self-pay | Admitting: Nurse Practitioner

## 2021-09-06 ENCOUNTER — Other Ambulatory Visit: Payer: Self-pay | Admitting: Nurse Practitioner

## 2021-09-06 NOTE — Telephone Encounter (Signed)
Requested Prescriptions  Pending Prescriptions Disp Refills  . metFORMIN (GLUCOPHAGE) 1000 MG tablet [Pharmacy Med Name: METFORMIN HCL 1,000 MG TABLET] 180 tablet 1    Sig: TAKE 1 TABLET (1,000 MG TOTAL) BY MOUTH 2 (TWO) TIMES DAILY WITH A MEAL.     Endocrinology:  Diabetes - Biguanides Passed - 09/06/2021  1:56 AM      Passed - Cr in normal range and within 360 days    Creatinine, Ser  Date Value Ref Range Status  03/14/2021 0.94 0.76 - 1.27 mg/dL Final         Passed - HBA1C is between 0 and 7.9 and within 180 days    HB A1C (BAYER DCA - WAIVED)  Date Value Ref Range Status  03/01/2020 7.0 (H) <7.0 % Final    Comment:                                          Diabetic Adult            <7.0                                       Healthy Adult        4.3 - 5.7                                                           (DCCT/NGSP) American Diabetes Association's Summary of Glycemic Recommendations for Adults with Diabetes: Hemoglobin A1c <7.0%. More stringent glycemic goals (A1c <6.0%) may further reduce complications at the cost of increased risk of hypoglycemia.    Hgb A1c MFr Bld  Date Value Ref Range Status  03/14/2021 7.6 (H) 4.8 - 5.6 % Final    Comment:             Prediabetes: 5.7 - 6.4          Diabetes: >6.4          Glycemic control for adults with diabetes: <7.0          Passed - eGFR in normal range and within 360 days    GFR calc Af Amer  Date Value Ref Range Status  03/01/2020 106 >59 mL/min/1.73 Final    Comment:    **In accordance with recommendations from the NKF-ASN Task force,**   [facility] is in the process of updating its eGFR calculation to the   2021 CKD-EPI creatinine equation that estimates kidney function   without a race variable.    GFR, Estimated  Date Value Ref Range Status  04/04/2020 >60 >60 mL/min Final    Comment:    (NOTE) Calculated using the CKD-EPI Creatinine Equation (2021)    eGFR  Date Value Ref Range Status  03/14/2021 101  >59 mL/min/1.73 Final         Passed - B12 Level in normal range and within 720 days    Vitamin B-12  Date Value Ref Range Status  03/01/2020 445 232 - 1,245 pg/mL Final         Passed - Valid encounter within last 6 months    Recent Outpatient Visits  5 months ago Annual physical exam   [facility] Jon Billings, NP   8 months ago Hyperlipidemia associated with type 2 diabetes mellitus ([place])   [facility] Jon Billings, NP   1 year ago Type 2 diabetes mellitus without obesity ([place])   [place], Henrine Screws T, NP   1 year ago Need for hepatitis C screening test   [facility] Merrie Roof [place], [place]   2 years ago Hyperlipidemia, unspecified hyperlipidemia type   [place], Lilia Argue, [place]      Future Appointments            In 6 days Jon Billings, NP [facility], [facility]           Passed - CBC within normal limits and completed in the last 12 months    WBC  Date Value Ref Range Status  03/14/2021 7.6 3.4 - 10.8 x10E3/uL Final  04/04/2020 8.8 4.0 - 10.5 K/uL Final   RBC  Date Value Ref Range Status  03/14/2021 5.50 4.14 - 5.80 x10E6/uL Final  04/04/2020 5.29 4.22 - 5.81 MIL/uL Final   Hemoglobin  Date Value Ref Range Status  03/14/2021 15.7 13.0 - 17.7 g/dL Final   Total hemoglobin  Date Value Ref Range Status  04/04/2020 16.3 (H) 12.0 - 16.0 g/dL Final    Comment:    CRITICAL RESULT CALLED TO, READ BACK BY AND VERIFIED WITH: Mosetta Pigeon, RN AT 1915 ON [id number] [place]    Hematocrit  Date Value Ref Range Status  03/14/2021 47.8 37.5 - 51.0 % Final   MCHC  Date Value Ref Range Status  03/14/2021 32.8 31.5 - 35.7 g/dL Final  04/04/2020 34.3 30.0 - 36.0 g/dL Final   [facility]  Date Value Ref Range Status  03/14/2021 28.5 26.6 - 33.0 pg Final  04/04/2020 29.5 26.0 - 34.0 pg Final   MCV  Date Value Ref Range Status  03/14/2021 87 79 - 97 fL Final    No results found for: PLTCOUNTKUC, LABPLAT, POCPLA RDW  Date Value Ref Range Status  03/14/2021 13.1 11.6 - 15.4 % Final

## 2021-09-07 ENCOUNTER — Other Ambulatory Visit: Payer: Self-pay | Admitting: Nurse Practitioner

## 2021-09-08 NOTE — Telephone Encounter (Signed)
Requested Prescriptions  Pending Prescriptions Disp Refills  . atorvastatin (LIPITOR) 20 MG tablet [Pharmacy Med Name: ATORVASTATIN 20 MG TABLET] 90 tablet 1    Sig: TAKE 1 TABLET BY MOUTH EVERY DAY     Cardiovascular:  Antilipid - Statins Failed - 09/07/2021  1:55 AM      Failed - Lipid Panel in normal range within the last 12 months    Cholesterol, Total  Date Value Ref Range Status  03/14/2021 126 100 - 199 mg/dL Final   Cholesterol Piccolo, Waived  Date Value Ref Range Status  08/07/2019 157 <200 mg/dL Final    Comment:                            Desirable                <200                         Borderline High      200- 239                         High                     >239    LDL Chol Calc (NIH)  Date Value Ref Range Status  03/14/2021 69 0 - 99 mg/dL Final   HDL  Date Value Ref Range Status  03/14/2021 40 >39 mg/dL Final   Triglycerides  Date Value Ref Range Status  03/14/2021 90 0 - 149 mg/dL Final   Triglycerides Piccolo,Waived  Date Value Ref Range Status  08/07/2019 106 <150 mg/dL Final    Comment:                            Normal                   <150                         Borderline High     150 - 199                         High                200 - 499                         Very High                >499          Passed - Patient is not pregnant      Passed - Valid encounter within last 12 months    Recent Outpatient Visits          5 months ago Annual physical exam   [facility] Jon Billings, NP   9 months ago Hyperlipidemia associated with type 2 diabetes mellitus ([place])   [facility] Jon Billings, NP   1 year ago Type 2 diabetes mellitus without obesity ([place])   [place], Henrine Screws T, NP   1 year ago Need for hepatitis C screening test   [facility] Volney American, [place]   2 years ago Hyperlipidemia, unspecified hyperlipidemia type   [facility] Volney American, PA-C      Future Appointments            In 1 week Jon Billings, NP [facility], [place]

## 2021-09-11 ENCOUNTER — Ambulatory Visit: Payer: BC Managed Care – PPO | Admitting: Nurse Practitioner

## 2021-09-12 ENCOUNTER — Ambulatory Visit: Payer: BC Managed Care – PPO | Admitting: Nurse Practitioner

## 2021-09-14 NOTE — Progress Notes (Unsigned)
There were no vitals taken for this visit.   Subjective:    Patient ID: Aaron Wood, male    DOB: June 20, 1975, 46 y.o.   MRN: [id number]  HPI: Aaron Wood is a 46 y.o. male  No chief complaint on file.  HYPERLIPIDEMIA Hyperlipidemia status: excellent compliance Satisfied with current treatment?  no Side effects:  yes Medication compliance: excellent compliance Past cholesterol meds: atorvastain (lipitor) Supplements: none Aspirin:  no The ASCVD Risk score (Arnett DK, et al., 2019) failed to calculate for the following reasons:   The valid total cholesterol range is 130 to 320 mg/dL Chest pain:  no Coronary artery disease:  no Family history CAD:  no Family history early CAD:  no  DIABETES Hypoglycemic episodes:no Polydipsia/polyuria: no Visual disturbance: no Chest pain: no Paresthesias: no Glucose Monitoring: no  Accucheck frequency: Not Checking  Fasting glucose:  Post prandial:  Evening:  Before meals: Taking Insulin?: no  Long acting insulin:  Short acting insulin: Blood Pressure Monitoring: not checking Retinal Examination: Not up to Date Foot Exam: Up to Date Diabetic Education: Not Completed Pneumovax: Not up to Date Influenza: unknown Aspirin: no   Denies HA, CP, SOB, dizziness, palpitations, visual changes, and lower extremity swelling.  Relevant past medical, surgical, family and social history reviewed and updated as indicated. Interim medical history since our last visit reviewed. Allergies and medications reviewed and updated.  Review of Systems  Eyes:  Negative for visual disturbance.  Respiratory:  Negative for chest tightness and shortness of breath.   Cardiovascular:  Negative for chest pain, palpitations and leg swelling.  Endocrine: Negative for polydipsia and polyuria.  Neurological:  Negative for dizziness, light-headedness, numbness and headaches.   Per HPI unless specifically indicated above     Objective:    There were  no vitals taken for this visit.  Wt Readings from Last 3 Encounters:  03/14/21 166 lb 4 oz (75.4 kg)  12/12/20 168 lb 4 oz (76.3 kg)  04/04/20 165 lb (74.8 kg)    Physical Exam Vitals and nursing note reviewed.  Constitutional:      General: He is not in acute distress.    Appearance: Normal appearance. He is not ill-appearing, toxic-appearing or diaphoretic.  HENT:     Head: Normocephalic.     Right Ear: External ear normal.     Left Ear: External ear normal.     Nose: Nose normal. No congestion or rhinorrhea.     Mouth/Throat:     Mouth: Mucous membranes are moist.  Eyes:     General:        Right eye: No discharge.        Left eye: No discharge.     Extraocular Movements: Extraocular movements intact.     Conjunctiva/sclera: Conjunctivae normal.     Pupils: Pupils are equal, round, and reactive to light.  Cardiovascular:     Rate and Rhythm: Normal rate and regular rhythm.     Heart sounds: No murmur heard. Pulmonary:     Effort: Pulmonary effort is normal. No respiratory distress.     Breath sounds: Normal breath sounds. No wheezing, rhonchi or rales.  Abdominal:     General: Abdomen is flat. Bowel sounds are normal.  Musculoskeletal:     Cervical back: Normal range of motion and neck supple.  Skin:    General: Skin is warm and dry.     Capillary Refill: Capillary refill takes less than 2 seconds.  Neurological:  General: No focal deficit present.     Mental Status: He is alert and oriented to person, place, and time.  Psychiatric:        Mood and Affect: Mood normal.        Behavior: Behavior normal.        Thought Content: Thought content normal.        Judgment: Judgment normal.    Results for orders placed or performed in visit on 03/14/21  Microscopic Examination   Urine  Result Value Ref Range   WBC, UA None seen 0 - 5 /hpf   RBC None seen 0 - 2 /hpf   Epithelial Cells (non renal) None seen 0 - 10 /hpf   Bacteria, UA None seen None seen/Few  TSH   Result Value Ref Range   TSH 1.670 0.450 - 4.500 uIU/mL  Lipid panel  Result Value Ref Range   Cholesterol, Total 126 100 - 199 mg/dL   Triglycerides 90 0 - 149 mg/dL   HDL 40 >39 mg/dL   VLDL Cholesterol Cal 17 5 - 40 mg/dL   LDL Chol Calc (NIH) 69 0 - 99 mg/dL   Chol/HDL Ratio 3.2 0.0 - 5.0 ratio  CBC with Differential/Platelet  Result Value Ref Range   WBC 7.6 3.4 - 10.8 x10E3/uL   RBC 5.50 4.14 - 5.80 x10E6/uL   Hemoglobin 15.7 13.0 - 17.7 g/dL   Hematocrit 47.8 37.5 - 51.0 %   MCV 87 79 - 97 fL   MCH 28.5 26.6 - 33.0 pg   MCHC 32.8 31.5 - 35.7 g/dL   RDW 13.1 11.6 - 15.4 %   Platelets 295 150 - 450 x10E3/uL   Neutrophils 66 Not Estab. %   Lymphs 20 Not Estab. %   Monocytes 9 Not Estab. %   Eos 4 Not Estab. %   Basos 1 Not Estab. %   Neutrophils Absolute 5.0 1.4 - 7.0 x10E3/uL   Lymphocytes Absolute 1.5 0.7 - 3.1 x10E3/uL   Monocytes Absolute 0.7 0.1 - 0.9 x10E3/uL   EOS (ABSOLUTE) 0.3 0.0 - 0.4 x10E3/uL   Basophils Absolute 0.1 0.0 - 0.2 x10E3/uL   Immature Granulocytes 0 Not Estab. %   Immature Grans (Abs) 0.0 0.0 - 0.1 x10E3/uL  Comprehensive metabolic panel  Result Value Ref Range   Glucose 118 (H) 70 - 99 mg/dL   BUN 15 6 - 24 mg/dL   Creatinine, Ser 0.94 0.76 - 1.27 mg/dL   eGFR 101 >59 mL/min/1.73   BUN/Creatinine Ratio 16 9 - 20   Sodium 135 134 - 144 mmol/L   Potassium 5.1 3.5 - 5.2 mmol/L   Chloride 100 96 - 106 mmol/L   CO2 24 20 - 29 mmol/L   Calcium 9.7 8.7 - 10.2 mg/dL   Total Protein 7.1 6.0 - 8.5 g/dL   Albumin 4.6 4.0 - 5.0 g/dL   Globulin, Total 2.5 1.5 - 4.5 g/dL   Albumin/Globulin Ratio 1.8 1.2 - 2.2   Bilirubin Total 0.3 0.0 - 1.2 mg/dL   Alkaline Phosphatase 77 44 - 121 IU/L   AST 15 0 - 40 IU/L   ALT 18 0 - 44 IU/L  Urinalysis, Routine w reflex microscopic  Result Value Ref Range   Specific Gravity, UA 1.015 1.005 - 1.030   pH, UA 5.5 5.0 - 7.5   Color, UA Yellow Yellow   Appearance Ur Clear Clear   Leukocytes,UA Negative  Negative   Protein,UA Negative Negative/Trace   Glucose, UA 3+ (A)  Negative   Ketones, UA 1+ (A) Negative   RBC, UA Trace (A) Negative   Bilirubin, UA Negative Negative   Urobilinogen, Ur 0.2 0.2 - 1.0 mg/dL   Nitrite, UA Negative Negative   Microscopic Examination See below:   HgB A1c  Result Value Ref Range   Hgb A1c MFr Bld 7.6 (H) 4.8 - 5.6 %   Est. average glucose Bld gHb Est-mCnc 171 mg/dL  Cologuard  Result Value Ref Range   COLOGUARD Negative Negative      Assessment & Plan:   Problem List Items Addressed This Visit      Respiratory   OSA (obstructive sleep apnea)     Endocrine   Type 2 diabetes mellitus without obesity (HCC)   Hyperlipidemia associated with type 2 diabetes mellitus ([place]) - Primary     Follow up plan: No follow-ups on file.

## 2021-09-15 ENCOUNTER — Ambulatory Visit: Payer: BC Managed Care – PPO | Admitting: Nurse Practitioner

## 2021-09-15 ENCOUNTER — Encounter: Payer: Self-pay | Admitting: Nurse Practitioner

## 2021-09-15 VITALS — BP 121/74 | HR 87 | Temp 97.7°F | Wt 161.4 lb

## 2021-09-15 DIAGNOSIS — E119 Type 2 diabetes mellitus without complications: Secondary | ICD-10-CM | POA: Diagnosis not present

## 2021-09-15 DIAGNOSIS — E1169 Type 2 diabetes mellitus with other specified complication: Secondary | ICD-10-CM

## 2021-09-15 DIAGNOSIS — E785 Hyperlipidemia, unspecified: Secondary | ICD-10-CM | POA: Diagnosis not present

## 2021-09-15 DIAGNOSIS — G4733 Obstructive sleep apnea (adult) (pediatric): Secondary | ICD-10-CM

## 2021-09-15 MED ORDER — DAPAGLIFLOZIN PROPANEDIOL 10 MG PO TABS: 10.0000 mg | ORAL_TABLET | Freq: Every day | ORAL | 1 refills | Status: DC

## 2021-09-15 MED ORDER — SEMAGLUTIDE (1 MG/DOSE) 4 MG/3ML ~~LOC~~ SOPN: 1.0000 mg | PEN_INJECTOR | SUBCUTANEOUS | 1 refills | Status: DC

## 2021-09-15 MED ORDER — SILDENAFIL CITRATE 50 MG PO TABS: 50.0000 mg | ORAL_TABLET | Freq: Every day | ORAL | 3 refills | Status: DC | PRN

## 2021-09-15 NOTE — Assessment & Plan Note (Signed)
Chronic.  Controlled.  Continue with current medication regimen on Atorvastatin 28m daily.  Refills sent today.  Labs ordered today.  Return to clinic in 6 months for reevaluation.  Call sooner if concerns arise.

## 2021-09-15 NOTE — Assessment & Plan Note (Signed)
Chronic.  Controlled.  Last A1c was 7.6 which was improved from 8.1.  Continue with current medication regimen on Farxiga 57m, Ozempic 166mand Metformin 100023mID.  Refills sent today.  Labs ordered today.  Return to clinic in 6 months for reevaluation.  Call sooner if concerns arise.

## 2021-09-16 LAB — LIPID PANEL
Chol/HDL Ratio: 2.9 ratio (ref 0.0–5.0)
Cholesterol, Total: 120 mg/dL (ref 100–199)
HDL: 41 mg/dL (ref >39)
LDL Chol Calc (NIH): 65 mg/dL (ref 0–99)
Triglycerides: 69 mg/dL (ref 0–149)
VLDL Cholesterol Cal: 14 mg/dL (ref 5–40)

## 2021-09-16 LAB — COMPREHENSIVE METABOLIC PANEL
ALT: 10 IU/L (ref 0–44)
AST: 12 IU/L (ref 0–40)
Albumin/Globulin Ratio: 1.9 (ref 1.2–2.2)
Albumin: 4.7 g/dL (ref 4.0–5.0)
Alkaline Phosphatase: 74 IU/L (ref 44–121)
BUN/Creatinine Ratio: 14 (ref 9–20)
BUN: 15 mg/dL (ref 6–24)
Bilirubin Total: 0.5 mg/dL (ref 0.0–1.2)
CO2: 21 mmol/L (ref 20–29)
Calcium: 9.6 mg/dL (ref 8.7–10.2)
Chloride: 101 mmol/L (ref 96–106)
Creatinine, Ser: 1.04 mg/dL (ref 0.76–1.27)
Globulin, Total: 2.5 g/dL (ref 1.5–4.5)
Glucose: 93 mg/dL (ref 70–99)
Potassium: 4.5 mmol/L (ref 3.5–5.2)
Sodium: 137 mmol/L (ref 134–144)
Total Protein: 7.2 g/dL (ref 6.0–8.5)
eGFR: 89 mL/min/{1.73_m2} (ref >59)

## 2021-09-16 LAB — HEMOGLOBIN A1C
Est. average glucose Bld gHb Est-mCnc: 180 mg/dL
Hgb A1c MFr Bld: 7.9 % — ABNORMAL HIGH (ref 4.8–5.6)

## 2021-09-17 NOTE — Progress Notes (Signed)
Please let patietn know that his A1c increased from 7.6 to 7.9.  I recommend he continue with the [place] and Metformin.  I'd like him to follow a low fat diet and exercise 5x weekly for at least 30 minutes.  I'd like him to come back in 3 months instead of 6 to make sure it doesn't increase again.  Please move patient's appointment up.

## 2021-12-03 ENCOUNTER — Other Ambulatory Visit: Payer: Self-pay | Admitting: Nurse Practitioner

## 2021-12-05 NOTE — Telephone Encounter (Signed)
Requested Prescriptions  Pending Prescriptions Disp Refills  . metFORMIN (GLUCOPHAGE) 1000 MG tablet [Pharmacy Med Name: METFORMIN HCL 1,000 MG TABLET] 180 tablet 0    Sig: TAKE 1 TABLET (1,000 MG TOTAL) BY MOUTH TWICE A DAY WITH FOOD     Endocrinology:  Diabetes - Biguanides Passed - 12/03/2021  1:24 AM      Passed - Cr in normal range and within 360 days    Creatinine, Ser  Date Value Ref Range Status  09/15/2021 1.04 0.76 - 1.27 mg/dL Final         Passed - HBA1C is between 0 and 7.9 and within 180 days    HB A1C (BAYER DCA - WAIVED)  Date Value Ref Range Status  03/01/2020 7.0 (H) <7.0 % Final    Comment:                                          Diabetic Adult            <7.0                                       Healthy Adult        4.3 - 5.7                                                           (DCCT/NGSP) American Diabetes Association's Summary of Glycemic Recommendations for Adults with Diabetes: Hemoglobin A1c <7.0%. More stringent glycemic goals (A1c <6.0%) may further reduce complications at the cost of increased risk of hypoglycemia.    Hgb A1c MFr Bld  Date Value Ref Range Status  09/15/2021 7.9 (H) 4.8 - 5.6 % Final    Comment:             Prediabetes: 5.7 - 6.4          Diabetes: >6.4          Glycemic control for adults with diabetes: <7.0          Passed - eGFR in normal range and within 360 days    GFR calc Af Amer  Date Value Ref Range Status  03/01/2020 106 >59 mL/min/1.73 Final    Comment:    **In accordance with recommendations from the NKF-ASN Task force,**   [facility] is in the process of updating its eGFR calculation to the   2021 CKD-EPI creatinine equation that estimates kidney function   without a race variable.    GFR, Estimated  Date Value Ref Range Status  04/04/2020 >60 >60 mL/min Final    Comment:    (NOTE) Calculated using the CKD-EPI Creatinine Equation (2021)    eGFR  Date Value Ref Range Status  09/15/2021 89 >59  mL/min/1.73 Final         Passed - B12 Level in normal range and within 720 days    Vitamin B-12  Date Value Ref Range Status  03/01/2020 445 232 - 1,245 pg/mL Final         Passed - Valid encounter within last 6 months    Recent Outpatient Visits  2 months ago Hyperlipidemia associated with type 2 diabetes mellitus ([place])   [facility] Jon Billings, NP   8 months ago Annual physical exam   [facility] Jon Billings, NP   11 months ago Hyperlipidemia associated with type 2 diabetes mellitus ([place])   [facility] Jon Billings, NP   1 year ago Type 2 diabetes mellitus without obesity ([place])   [place], Henrine Screws T, NP   2 years ago Need for hepatitis C screening test   [facility], [place]      Future Appointments            In 2 weeks Jon Billings, NP [facility], [place]   In 3 months Jon Billings, NP [facility], [place] - CBC within normal limits and completed in the last 12 months    WBC  Date Value Ref Range Status  03/14/2021 7.6 3.4 - 10.8 x10E3/uL Final  04/04/2020 8.8 4.0 - 10.5 K/uL Final   RBC  Date Value Ref Range Status  03/14/2021 5.50 4.14 - 5.80 x10E6/uL Final  04/04/2020 5.29 4.22 - 5.81 MIL/uL Final   Hemoglobin  Date Value Ref Range Status  03/14/2021 15.7 13.0 - 17.7 g/dL Final   Total hemoglobin  Date Value Ref Range Status  04/04/2020 16.3 (H) 12.0 - 16.0 g/dL Final    Comment:    CRITICAL RESULT CALLED TO, READ BACK BY AND VERIFIED WITH: Mosetta Pigeon, RN AT 1915 ON [id number] [place]    Hematocrit  Date Value Ref Range Status  03/14/2021 47.8 37.5 - 51.0 % Final   MCHC  Date Value Ref Range Status  03/14/2021 32.8 31.5 - 35.7 g/dL Final  04/04/2020 34.3 30.0 - 36.0 g/dL Final   [facility]  Date Value Ref Range Status  03/14/2021 28.5 26.6 - 33.0 pg Final  04/04/2020 29.5 26.0 - 34.0 pg  Final   MCV  Date Value Ref Range Status  03/14/2021 87 79 - 97 fL Final   No results found for: "PLTCOUNTKUC", "LABPLAT", "POCPLA" RDW  Date Value Ref Range Status  03/14/2021 13.1 11.6 - 15.4 % Final

## 2021-12-19 NOTE — Progress Notes (Unsigned)
There were no vitals taken for this visit.   Subjective:    Patient ID: Aaron Wood, male    DOB: 02-04-1976, 46 y.o.   MRN: [id number]  HPI: Aaron Wood is a 46 y.o. male  No chief complaint on file.  HYPERLIPIDEMIA Hyperlipidemia status: excellent compliance Satisfied with current treatment?  no Side effects:  yes Medication compliance: excellent compliance Past cholesterol meds: atorvastain (lipitor) Supplements: none Aspirin:  no The ASCVD Risk score (Arnett DK, et al., 2019) failed to calculate for the following reasons:   The valid total cholesterol range is 130 to 320 mg/dL Chest pain:  no Coronary artery disease:  no Family history CAD:  no Family history early CAD:  no  DIABETES Hypoglycemic episodes:no Polydipsia/polyuria: no Visual disturbance: no Chest pain: no Paresthesias: no Glucose Monitoring: no  Accucheck frequency: Not Checking  Fasting glucose:  Post prandial:  Evening:  Before meals: Taking Insulin?: no  Long acting insulin:  Short acting insulin: Blood Pressure Monitoring: not checking Retinal Examination: Not up to Date Foot Exam: Up to Date Diabetic Education: Not Completed Pneumovax: Not up to Date Influenza: unknown Aspirin: no   Denies HA, CP, SOB, dizziness, palpitations, visual changes, and lower extremity swelling.  Relevant past medical, surgical, family and social history reviewed and updated as indicated. Interim medical history since our last visit reviewed. Allergies and medications reviewed and updated.  Review of Systems  Eyes:  Negative for visual disturbance.  Respiratory:  Negative for chest tightness and shortness of breath.   Cardiovascular:  Negative for chest pain, palpitations and leg swelling.  Endocrine: Negative for polydipsia and polyuria.  Neurological:  Negative for dizziness, light-headedness, numbness and headaches.   Per HPI unless specifically indicated above     Objective:    There were  no vitals taken for this visit.  Wt Readings from Last 3 Encounters:  09/15/21 161 lb 6.4 oz (73.2 kg)  03/14/21 166 lb 4 oz (75.4 kg)  12/12/20 168 lb 4 oz (76.3 kg)    Physical Exam Vitals and nursing note reviewed.  Constitutional:      General: He is not in acute distress.    Appearance: Normal appearance. He is normal weight. He is not ill-appearing, toxic-appearing or diaphoretic.  HENT:     Head: Normocephalic.     Right Ear: External ear normal.     Left Ear: External ear normal.     Nose: Nose normal. No congestion or rhinorrhea.     Mouth/Throat:     Mouth: Mucous membranes are moist.  Eyes:     General:        Right eye: No discharge.        Left eye: No discharge.     Extraocular Movements: Extraocular movements intact.     Conjunctiva/sclera: Conjunctivae normal.     Pupils: Pupils are equal, round, and reactive to light.  Cardiovascular:     Rate and Rhythm: Normal rate and regular rhythm.     Heart sounds: No murmur heard. Pulmonary:     Effort: Pulmonary effort is normal. No respiratory distress.     Breath sounds: Normal breath sounds. No wheezing, rhonchi or rales.  Abdominal:     General: Abdomen is flat. Bowel sounds are normal.  Musculoskeletal:     Cervical back: Normal range of motion and neck supple.  Skin:    General: Skin is warm and dry.     Capillary Refill: Capillary refill takes less than 2  seconds.  Neurological:     General: No focal deficit present.     Mental Status: He is alert and oriented to person, place, and time.  Psychiatric:        Mood and Affect: Mood normal.        Behavior: Behavior normal.        Thought Content: Thought content normal.        Judgment: Judgment normal.    Results for orders placed or performed in visit on 09/15/21  Comp Met (CMET)  Result Value Ref Range   Glucose 93 70 - 99 mg/dL   BUN 15 6 - 24 mg/dL   Creatinine, Ser 1.04 0.76 - 1.27 mg/dL   eGFR 89 >59 mL/min/1.73   BUN/Creatinine Ratio 14 9 -  20   Sodium 137 134 - 144 mmol/L   Potassium 4.5 3.5 - 5.2 mmol/L   Chloride 101 96 - 106 mmol/L   CO2 21 20 - 29 mmol/L   Calcium 9.6 8.7 - 10.2 mg/dL   Total Protein 7.2 6.0 - 8.5 g/dL   Albumin 4.7 4.0 - 5.0 g/dL   Globulin, Total 2.5 1.5 - 4.5 g/dL   Albumin/Globulin Ratio 1.9 1.2 - 2.2   Bilirubin Total 0.5 0.0 - 1.2 mg/dL   Alkaline Phosphatase 74 44 - 121 IU/L   AST 12 0 - 40 IU/L   ALT 10 0 - 44 IU/L  Lipid Profile  Result Value Ref Range   Cholesterol, Total 120 100 - 199 mg/dL   Triglycerides 69 0 - 149 mg/dL   HDL 41 >39 mg/dL   VLDL Cholesterol Cal 14 5 - 40 mg/dL   LDL Chol Calc (NIH) 65 0 - 99 mg/dL   Chol/HDL Ratio 2.9 0.0 - 5.0 ratio  HgB A1c  Result Value Ref Range   Hgb A1c MFr Bld 7.9 (H) 4.8 - 5.6 %   Est. average glucose Bld gHb Est-mCnc 180 mg/dL      Assessment & Plan:   Problem List Items Addressed This Visit      Endocrine   Type 2 diabetes mellitus without obesity ([place])   Hyperlipidemia associated with type 2 diabetes mellitus ([place]) - Primary     Follow up plan: No follow-ups on file.

## 2021-12-20 ENCOUNTER — Ambulatory Visit: Payer: BC Managed Care – PPO | Admitting: Nurse Practitioner

## 2021-12-20 ENCOUNTER — Encounter: Payer: Self-pay | Admitting: Nurse Practitioner

## 2021-12-20 VITALS — BP 116/76 | HR 89 | Temp 98.4°F | Wt 161.3 lb

## 2021-12-20 DIAGNOSIS — E119 Type 2 diabetes mellitus without complications: Secondary | ICD-10-CM | POA: Diagnosis not present

## 2021-12-20 DIAGNOSIS — E785 Hyperlipidemia, unspecified: Secondary | ICD-10-CM

## 2021-12-20 DIAGNOSIS — E1169 Type 2 diabetes mellitus with other specified complication: Secondary | ICD-10-CM | POA: Diagnosis not present

## 2021-12-20 LAB — MICROALBUMIN, URINE WAIVED
Creatinine, Urine Waived: 100 mg/dL (ref 10–300)
Microalb, Ur Waived: 10 mg/L (ref 0–19)
Microalb/Creat Ratio: <30 mg/g (ref <30)

## 2021-12-20 NOTE — Assessment & Plan Note (Signed)
Chronic.  Controlled.  Continue with current medication regimen on Atorvastatin 20mg daily.  Refills sent today.  Labs ordered today.  Return to clinic in 6 months for reevaluation.  Call sooner if concerns arise.   

## 2021-12-20 NOTE — Assessment & Plan Note (Signed)
Chronic.  Controlled.  Last A1c was 7.9 which was up from 7.6.   Continue with current medication regimen on Farxiga 10mg , Ozempic 1mg  and Metformin 1000mg  BID.  Can increase Ozempic to 2mg .  Microalbumin ordered today.  Encouraged patient to get eye exam.  Labs ordered today.  Return to clinic in 6 months for reevaluation.  Call sooner if concerns arise.

## 2021-12-21 LAB — COMPREHENSIVE METABOLIC PANEL
ALT: 13 IU/L (ref 0–44)
AST: 16 IU/L (ref 0–40)
Albumin/Globulin Ratio: 2 (ref 1.2–2.2)
Albumin: 4.6 g/dL (ref 4.1–5.1)
Alkaline Phosphatase: 59 IU/L (ref 44–121)
BUN/Creatinine Ratio: 14 (ref 9–20)
BUN: 14 mg/dL (ref 6–24)
Bilirubin Total: 0.3 mg/dL (ref 0.0–1.2)
CO2: 22 mmol/L (ref 20–29)
Calcium: 9.3 mg/dL (ref 8.7–10.2)
Chloride: 102 mmol/L (ref 96–106)
Creatinine, Ser: 1.02 mg/dL (ref 0.76–1.27)
Globulin, Total: 2.3 g/dL (ref 1.5–4.5)
Glucose: 114 mg/dL — ABNORMAL HIGH (ref 70–99)
Potassium: 4.7 mmol/L (ref 3.5–5.2)
Sodium: 137 mmol/L (ref 134–144)
Total Protein: 6.9 g/dL (ref 6.0–8.5)
eGFR: 91 mL/min/{1.73_m2} (ref >59)

## 2021-12-21 LAB — HEMOGLOBIN A1C
Est. average glucose Bld gHb Est-mCnc: 171 mg/dL
Hgb A1c MFr Bld: 7.6 % — ABNORMAL HIGH (ref 4.8–5.6)

## 2021-12-21 NOTE — Progress Notes (Signed)
HI Aaron Wood. It was nice to see you yesterday.  Your lab work looks good.  Your A1c improved to 7.6.  Keep up the good work.  No concerns at this time. Continue with your current medication regimen.  Follow up as discussed.  Please let me know if you have any questions.

## 2022-02-28 ENCOUNTER — Other Ambulatory Visit: Payer: Self-pay | Admitting: Nurse Practitioner

## 2022-02-28 NOTE — Telephone Encounter (Signed)
Requested Prescriptions  Pending Prescriptions Disp Refills   atorvastatin (LIPITOR) 20 MG tablet [Pharmacy Med Name: ATORVASTATIN 20 MG TABLET] 90 tablet 1    Sig: TAKE 1 TABLET BY MOUTH EVERY DAY     Cardiovascular:  Antilipid - Statins Failed - 02/28/2022  2:02 AM      Failed - Lipid Panel in normal range within the last 12 months    Cholesterol, Total  Date Value Ref Range Status  09/15/2021 120 100 - 199 mg/dL Final   Cholesterol Piccolo, Waived  Date Value Ref Range Status  08/07/2019 157 <200 mg/dL Final    Comment:                            Desirable                <200                         Borderline High      200- 239                         High                     >239    LDL Chol Calc (NIH)  Date Value Ref Range Status  09/15/2021 65 0 - 99 mg/dL Final   HDL  Date Value Ref Range Status  09/15/2021 41 >39 mg/dL Final   Triglycerides  Date Value Ref Range Status  09/15/2021 69 0 - 149 mg/dL Final   Triglycerides Piccolo,Waived  Date Value Ref Range Status  08/07/2019 106 <150 mg/dL Final    Comment:                            Normal                   <150                         Borderline High     150 - 199                         High                200 - 499                         Very High                >499          Passed - Patient is not pregnant      Passed - Valid encounter within last 12 months    Recent Outpatient Visits           2 months ago Hyperlipidemia associated with type 2 diabetes mellitus (HCC)   [facility] Larae Grooms, NP   5 months ago Hyperlipidemia associated with type 2 diabetes mellitus (HCC)   [facility] Larae Grooms, NP   11 months ago Annual physical exam   [facility] Larae Grooms, NP   1 year ago Hyperlipidemia associated with type 2 diabetes mellitus (HCC)   [facility] Larae Grooms, NP   1 year ago Type 2 diabetes mellitus without  obesity [facility])   [facility] Marjie Skiff, NP       Future Appointments             In 2 weeks Larae Grooms, NP [facility], [facility]   In 3 months Larae Grooms, NP [facility], [facility]

## 2022-03-10 ENCOUNTER — Other Ambulatory Visit: Payer: Self-pay | Admitting: Nurse Practitioner

## 2022-03-13 NOTE — Telephone Encounter (Signed)
Requested Prescriptions  Pending Prescriptions Disp Refills   metFORMIN (GLUCOPHAGE) 1000 MG tablet [Pharmacy Med Name: METFORMIN HCL 1,000 MG TABLET] 180 tablet 0    Sig: TAKE 1 TABLET (1,000 MG TOTAL) BY MOUTH TWICE A DAY WITH FOOD     Endocrinology:  Diabetes - Biguanides Failed - 03/10/2022 11:30 AM      Failed - B12 Level in normal range and within 720 days    Vitamin B-12  Date Value Ref Range Status  03/01/2020 445 232 - 1,245 pg/mL Final         Failed - CBC within normal limits and completed in the last 12 months    WBC  Date Value Ref Range Status  03/14/2021 7.6 3.4 - 10.8 x10E3/uL Final  04/04/2020 8.8 4.0 - 10.5 K/uL Final   RBC  Date Value Ref Range Status  03/14/2021 5.50 4.14 - 5.80 x10E6/uL Final  04/04/2020 5.29 4.22 - 5.81 MIL/uL Final   Hemoglobin  Date Value Ref Range Status  03/14/2021 15.7 13.0 - 17.7 g/dL Final   Total hemoglobin  Date Value Ref Range Status  04/04/2020 16.3 (H) 12.0 - 16.0 g/dL Final    Comment:    CRITICAL RESULT CALLED TO, READ BACK BY AND VERIFIED WITH: [place], RN AT [phone] ON [id number] [place]    Hematocrit  Date Value Ref Range Status  03/14/2021 47.8 37.5 - 51.0 % Final   MCHC  Date Value Ref Range Status  03/14/2021 32.8 31.5 - 35.7 g/dL Final  04/04/2020 34.3 30.0 - 36.0 g/dL Final   [facility]  Date Value Ref Range Status  03/14/2021 28.5 26.6 - 33.0 pg Final  04/04/2020 29.5 26.0 - 34.0 pg Final   MCV  Date Value Ref Range Status  03/14/2021 87 79 - 97 fL Final   No results found for: "PLTCOUNTKUC", "LABPLAT", "POCPLA" RDW  Date Value Ref Range Status  03/14/2021 13.1 11.6 - 15.4 % Final         Passed - Cr in normal range and within 360 days    Creatinine, Ser  Date Value Ref Range Status  12/20/2021 1.02 0.76 - 1.27 mg/dL Final         Passed - HBA1C is between 0 and 7.9 and within 180 days    HB A1C (BAYER DCA - WAIVED)  Date Value Ref Range Status  03/01/2020 7.0 (H) <7.0 % Final    Comment:                                           Diabetic Adult            <7.0                                       Healthy Adult        4.3 - 5.7                                                           (DCCT/NGSP) American Diabetes Association's Summary of Glycemic Recommendations for Adults with Diabetes: Hemoglobin A1c <7.0%. More stringent glycemic goals (A1c <  6.0%) may further reduce complications at the cost of increased risk of hypoglycemia.    Hgb A1c MFr Bld  Date Value Ref Range Status  12/20/2021 7.6 (H) 4.8 - 5.6 % Final    Comment:             Prediabetes: 5.7 - 6.4          Diabetes: >6.4          Glycemic control for adults with diabetes: <7.0          Passed - eGFR in normal range and within 360 days    GFR calc Af Amer  Date Value Ref Range Status  03/01/2020 106 >59 mL/min/1.73 Final    Comment:    **In accordance with recommendations from the NKF-ASN Task force,**   [facility] is in the process of updating its eGFR calculation to the   2021 CKD-EPI creatinine equation that estimates kidney function   without a race variable.    GFR, Estimated  Date Value Ref Range Status  04/04/2020 >60 >60 mL/min Final    Comment:    (NOTE) Calculated using the CKD-EPI Creatinine Equation (2021)    eGFR  Date Value Ref Range Status  12/20/2021 91 >59 mL/min/1.73 Final         Passed - Valid encounter within last 6 months    Recent Outpatient Visits           2 months ago Hyperlipidemia associated with type 2 diabetes mellitus ([place])   [facility] Jon Billings, NP   5 months ago Hyperlipidemia associated with type 2 diabetes mellitus ([place])   [facility] Jon Billings, NP   12 months ago Annual physical exam   [facility] Jon Billings, NP   1 year ago Hyperlipidemia associated with type 2 diabetes mellitus ([place])   [facility] Jon Billings, NP   2 years ago Type 2 diabetes mellitus without obesity ([place])    [place], Jolene T, NP       Future Appointments             In 1 week Jon Billings, NP [facility], [place]   In 3 months Jon Billings, NP [facility], [place]

## 2022-03-19 NOTE — Progress Notes (Deleted)
There were no vitals taken for this visit.   Subjective:    Patient ID: Aaron Wood, male    DOB: 1976/01/11, 46 y.o.   MRN: [id number]  HPI: Aaron Wood is a 46 y.o. male  No chief complaint on file.  HYPERLIPIDEMIA Hyperlipidemia status: excellent compliance Satisfied with current treatment?  no Side effects:  yes Medication compliance: excellent compliance Past cholesterol meds: atorvastain (lipitor) Supplements: none Aspirin:  no The ASCVD Risk score (Arnett DK, et al., 2019) failed to calculate for the following reasons:   The valid total cholesterol range is 130 to 320 mg/dL Chest pain:  no Coronary artery disease:  no Family history CAD:  no Family history early CAD:  no  DIABETES Hypoglycemic episodes:no Polydipsia/polyuria: no Visual disturbance: no Chest pain: no Paresthesias: no Glucose Monitoring: no  Accucheck frequency: Not Checking  Fasting glucose:  Post prandial:  Evening:  Before meals: Taking Insulin?: no  Long acting insulin:  Short acting insulin: Blood Pressure Monitoring: not checking Retinal Examination: Not up to Date Foot Exam: Up to Date Diabetic Education: Not Completed Pneumovax: Not up to Date Influenza: unknown Aspirin: no   Denies HA, CP, SOB, dizziness, palpitations, visual changes, and lower extremity swelling.  Relevant past medical, surgical, family and social history reviewed and updated as indicated. Interim medical history since our last visit reviewed. Allergies and medications reviewed and updated.  Review of Systems  Eyes:  Negative for visual disturbance.  Respiratory:  Negative for chest tightness and shortness of breath.   Cardiovascular:  Negative for chest pain, palpitations and leg swelling.  Endocrine: Negative for polydipsia and polyuria.  Neurological:  Negative for dizziness, light-headedness, numbness and headaches.   Per HPI unless specifically indicated above     Objective:    There were  no vitals taken for this visit.  Wt Readings from Last 3 Encounters:  12/20/21 161 lb 4.8 oz (73.2 kg)  09/15/21 161 lb 6.4 oz (73.2 kg)  03/14/21 166 lb 4 oz (75.4 kg)    Physical Exam Vitals and nursing note reviewed.  Constitutional:      General: He is not in acute distress.    Appearance: Normal appearance. He is normal weight. He is not ill-appearing, toxic-appearing or diaphoretic.  HENT:     Head: Normocephalic.     Right Ear: External ear normal.     Left Ear: External ear normal.     Nose: Nose normal. No congestion or rhinorrhea.     Mouth/Throat:     Mouth: Mucous membranes are moist.  Eyes:     General:        Right eye: No discharge.        Left eye: No discharge.     Extraocular Movements: Extraocular movements intact.     Conjunctiva/sclera: Conjunctivae normal.     Pupils: Pupils are equal, round, and reactive to light.  Cardiovascular:     Rate and Rhythm: Normal rate and regular rhythm.     Heart sounds: No murmur heard. Pulmonary:     Effort: Pulmonary effort is normal. No respiratory distress.     Breath sounds: Normal breath sounds. No wheezing, rhonchi or rales.  Abdominal:     General: Abdomen is flat. Bowel sounds are normal.  Musculoskeletal:     Cervical back: Normal range of motion and neck supple.  Skin:    General: Skin is warm and dry.     Capillary Refill: Capillary refill takes less than 2  seconds.  Neurological:     General: No focal deficit present.     Mental Status: He is alert and oriented to person, place, and time.  Psychiatric:        Mood and Affect: Mood normal.        Behavior: Behavior normal.        Thought Content: Thought content normal.        Judgment: Judgment normal.    Results for orders placed or performed in visit on 12/20/21  Comp Met (CMET)  Result Value Ref Range   Glucose 114 (H) 70 - 99 mg/dL   BUN 14 6 - 24 mg/dL   Creatinine, Ser 1.02 0.76 - 1.27 mg/dL   eGFR 91 >59 mL/min/1.73   BUN/Creatinine Ratio  14 9 - 20   Sodium 137 134 - 144 mmol/L   Potassium 4.7 3.5 - 5.2 mmol/L   Chloride 102 96 - 106 mmol/L   CO2 22 20 - 29 mmol/L   Calcium 9.3 8.7 - 10.2 mg/dL   Total Protein 6.9 6.0 - 8.5 g/dL   Albumin 4.6 4.1 - 5.1 g/dL   Globulin, Total 2.3 1.5 - 4.5 g/dL   Albumin/Globulin Ratio 2.0 1.2 - 2.2   Bilirubin Total 0.3 0.0 - 1.2 mg/dL   Alkaline Phosphatase 59 44 - 121 IU/L   AST 16 0 - 40 IU/L   ALT 13 0 - 44 IU/L  HgB A1c  Result Value Ref Range   Hgb A1c MFr Bld 7.6 (H) 4.8 - 5.6 %   Est. average glucose Bld gHb Est-mCnc 171 mg/dL  Microalbumin, Urine Waived  Result Value Ref Range   Microalb, Ur Waived 10 0 - 19 mg/L   Creatinine, Urine Waived 100 10 - 300 mg/dL   Microalb/Creat Ratio <30 <30 mg/g      Assessment & Plan:   Problem List Items Addressed This Visit      Endocrine   Type 2 diabetes mellitus without obesity ([place])   Hyperlipidemia associated with type 2 diabetes mellitus ([place]) - Primary     Follow up plan: No follow-ups on file.

## 2022-03-20 ENCOUNTER — Ambulatory Visit: Payer: BC Managed Care – PPO | Admitting: Nurse Practitioner

## 2022-03-26 ENCOUNTER — Telehealth: Payer: Self-pay | Admitting: Nurse Practitioner

## 2022-03-26 ENCOUNTER — Encounter: Payer: Self-pay | Admitting: Nurse Practitioner

## 2022-03-26 MED ORDER — SEMAGLUTIDE (1 MG/DOSE) 4 MG/3ML ~~LOC~~ SOPN: 1.0000 mg | PEN_INJECTOR | SUBCUTANEOUS | 1 refills | Status: DC

## 2022-03-26 NOTE — Telephone Encounter (Signed)
Medication sent to the pharmacy.

## 2022-03-26 NOTE — Telephone Encounter (Signed)
Medication Refill - Medication: Semaglutide, 1 MG/DOSE, 4 MG/3ML SOPN   Has the patient contacted their pharmacy? Yes.   (Agent: If no, request that the patient contact the pharmacy for the refill. If patient does not wish to contact the pharmacy document the reason why and proceed with request.) (Agent: If yes, when and what did the pharmacy advise?)  Preferred Pharmacy (with phone number or street name):   [facility] - [place], [place] - [address]  [address] [place] [address]  Phone: [phone] Fax: [phone]   Has the patient been seen for an appointment in the last year OR does the patient have an upcoming appointment? Yes.    Agent: Please be advised that RX refills may take up to 3 business days. We ask that you follow-up with your pharmacy.

## 2022-04-17 NOTE — Progress Notes (Unsigned)
There were no vitals taken for this visit.   Subjective:    Patient ID: Aaron Wood, male    DOB: [birth year], 47 y.o.   MRN: [id number]  HPI: Aaron Wood is a 47 y.o. male  No chief complaint on file.  HYPERLIPIDEMIA Hyperlipidemia status: excellent compliance Satisfied with current treatment?  no Side effects:  yes Medication compliance: excellent compliance Past cholesterol meds: atorvastain (lipitor) Supplements: none Aspirin:  no The ASCVD Risk score (Arnett DK, et al., 2019) failed to calculate for the following reasons:   The valid total cholesterol range is 130 to 320 mg/dL Chest pain:  no Coronary artery disease:  no Family history CAD:  no Family history early CAD:  no  DIABETES Hypoglycemic episodes:no Polydipsia/polyuria: no Visual disturbance: no Chest pain: no Paresthesias: no Glucose Monitoring: no  Accucheck frequency: Not Checking  Fasting glucose:  Post prandial:  Evening:  Before meals: Taking Insulin?: no  Long acting insulin:  Short acting insulin: Blood Pressure Monitoring: not checking Retinal Examination: Not up to Date Foot Exam: Up to Date Diabetic Education: Not Completed Pneumovax: Not up to Date Influenza: unknown Aspirin: no   Denies HA, CP, SOB, dizziness, palpitations, visual changes, and lower extremity swelling.  Relevant past medical, surgical, family and social history reviewed and updated as indicated. Interim medical history since our last visit reviewed. Allergies and medications reviewed and updated.  Review of Systems  Eyes:  Negative for visual disturbance.  Respiratory:  Negative for chest tightness and shortness of breath.   Cardiovascular:  Negative for chest pain, palpitations and leg swelling.  Endocrine: Negative for polydipsia and polyuria.  Neurological:  Negative for dizziness, light-headedness, numbness and headaches.    Per HPI unless specifically indicated above     Objective:    There  were no vitals taken for this visit.  Wt Readings from Last 3 Encounters:  12/20/21 161 lb 4.8 oz (73.2 kg)  09/15/21 161 lb 6.4 oz (73.2 kg)  03/14/21 166 lb 4 oz (75.4 kg)    Physical Exam Vitals and nursing note reviewed.  Constitutional:      General: He is not in acute distress.    Appearance: Normal appearance. He is normal weight. He is not ill-appearing, toxic-appearing or diaphoretic.  HENT:     Head: Normocephalic.     Right Ear: External ear normal.     Left Ear: External ear normal.     Nose: Nose normal. No congestion or rhinorrhea.     Mouth/Throat:     Mouth: Mucous membranes are moist.  Eyes:     General:        Right eye: No discharge.        Left eye: No discharge.     Extraocular Movements: Extraocular movements intact.     Conjunctiva/sclera: Conjunctivae normal.     Pupils: Pupils are equal, round, and reactive to light.  Cardiovascular:     Rate and Rhythm: Normal rate and regular rhythm.     Heart sounds: No murmur heard. Pulmonary:     Effort: Pulmonary effort is normal. No respiratory distress.     Breath sounds: Normal breath sounds. No wheezing, rhonchi or rales.  Abdominal:     General: Abdomen is flat. Bowel sounds are normal.  Musculoskeletal:     Cervical back: Normal range of motion and neck supple.  Skin:    General: Skin is warm and dry.     Capillary Refill: Capillary refill takes less than  2 seconds.  Neurological:     General: No focal deficit present.     Mental Status: He is alert and oriented to person, place, and time.  Psychiatric:        Mood and Affect: Mood normal.        Behavior: Behavior normal.        Thought Content: Thought content normal.        Judgment: Judgment normal.    Results for orders placed or performed in visit on 12/20/21  Comp Met (CMET)  Result Value Ref Range   Glucose 114 (H) 70 - 99 mg/dL   BUN 14 6 - 24 mg/dL   Creatinine, Ser 1.02 0.76 - 1.27 mg/dL   eGFR 91 >59 mL/min/1.73   BUN/Creatinine  Ratio 14 9 - 20   Sodium 137 134 - 144 mmol/L   Potassium 4.7 3.5 - 5.2 mmol/L   Chloride 102 96 - 106 mmol/L   CO2 22 20 - 29 mmol/L   Calcium 9.3 8.7 - 10.2 mg/dL   Total Protein 6.9 6.0 - 8.5 g/dL   Albumin 4.6 4.1 - 5.1 g/dL   Globulin, Total 2.3 1.5 - 4.5 g/dL   Albumin/Globulin Ratio 2.0 1.2 - 2.2   Bilirubin Total 0.3 0.0 - 1.2 mg/dL   Alkaline Phosphatase 59 44 - 121 IU/L   AST 16 0 - 40 IU/L   ALT 13 0 - 44 IU/L  HgB A1c  Result Value Ref Range   Hgb A1c MFr Bld 7.6 (H) 4.8 - 5.6 %   Est. average glucose Bld gHb Est-mCnc 171 mg/dL  Microalbumin, Urine Waived  Result Value Ref Range   Microalb, Ur Waived 10 0 - 19 mg/L   Creatinine, Urine Waived 100 10 - 300 mg/dL   Microalb/Creat Ratio <30 <30 mg/g      Assessment & Plan:   Problem List Items Addressed This Visit      Endocrine   Type 2 diabetes mellitus without obesity ([place])   Hyperlipidemia associated with type 2 diabetes mellitus ([place]) - Primary     Follow up plan: No follow-ups on file.

## 2022-04-18 ENCOUNTER — Encounter: Payer: Self-pay | Admitting: Nurse Practitioner

## 2022-04-18 ENCOUNTER — Ambulatory Visit: Payer: BC Managed Care – PPO | Admitting: Nurse Practitioner

## 2022-04-18 VITALS — BP 106/68 | HR 82 | Temp 97.8°F | Wt 169.7 lb

## 2022-04-18 DIAGNOSIS — E1169 Type 2 diabetes mellitus with other specified complication: Secondary | ICD-10-CM

## 2022-04-18 DIAGNOSIS — E119 Type 2 diabetes mellitus without complications: Secondary | ICD-10-CM

## 2022-04-18 DIAGNOSIS — E785 Hyperlipidemia, unspecified: Secondary | ICD-10-CM | POA: Diagnosis not present

## 2022-04-18 MED ORDER — SILDENAFIL CITRATE 50 MG PO TABS: 50.0000 mg | ORAL_TABLET | Freq: Every day | ORAL | 3 refills | Status: DC | PRN

## 2022-04-18 MED ORDER — METFORMIN HCL 1000 MG PO TABS: ORAL_TABLET | ORAL | 1 refills | Status: DC

## 2022-04-18 MED ORDER — DAPAGLIFLOZIN PROPANEDIOL 10 MG PO TABS: 10.0000 mg | ORAL_TABLET | Freq: Every day | ORAL | 1 refills | Status: DC

## 2022-04-18 MED ORDER — SEMAGLUTIDE (1 MG/DOSE) 4 MG/3ML ~~LOC~~ SOPN: 1.0000 mg | PEN_INJECTOR | SUBCUTANEOUS | 1 refills | Status: DC

## 2022-04-18 MED ORDER — ATORVASTATIN CALCIUM 20 MG PO TABS: 20.0000 mg | ORAL_TABLET | Freq: Every day | ORAL | 1 refills | Status: DC

## 2022-04-18 NOTE — Assessment & Plan Note (Signed)
Chronic.  Controlled.  Last A1c 7.6%.  Continue with current medication regimen of metformin 1000mg  BID, Ozempic 1mg  weekly, Farxiga 10mg  daily.  Labs ordered today.  Return to clinic in 3 months for reevaluation.  Call sooner if concerns arise.

## 2022-04-18 NOTE — Assessment & Plan Note (Signed)
Chronic.  Controlled.  Continue with current medication regimen of Atorvastatin daily.  Refills sent today.  Labs ordered today.  Return to clinic in 6 months for reevaluation.  Call sooner if concerns arise.    

## 2022-04-19 LAB — COMPREHENSIVE METABOLIC PANEL
ALT: 20 IU/L (ref 0–44)
AST: 15 IU/L (ref 0–40)
Albumin/Globulin Ratio: 1.9 (ref 1.2–2.2)
Albumin: 4.5 g/dL (ref 4.1–5.1)
Alkaline Phosphatase: 63 IU/L (ref 44–121)
BUN/Creatinine Ratio: 14 (ref 9–20)
BUN: 14 mg/dL (ref 6–24)
Bilirubin Total: 0.4 mg/dL (ref 0.0–1.2)
CO2: 22 mmol/L (ref 20–29)
Calcium: 9.6 mg/dL (ref 8.7–10.2)
Chloride: 100 mmol/L (ref 96–106)
Creatinine, Ser: 0.98 mg/dL (ref 0.76–1.27)
Globulin, Total: 2.4 g/dL (ref 1.5–4.5)
Glucose: 125 mg/dL — ABNORMAL HIGH (ref 70–99)
Potassium: 4.6 mmol/L (ref 3.5–5.2)
Sodium: 137 mmol/L (ref 134–144)
Total Protein: 6.9 g/dL (ref 6.0–8.5)
eGFR: 96 mL/min/{1.73_m2} (ref >59)

## 2022-04-19 LAB — HEMOGLOBIN A1C
Est. average glucose Bld gHb Est-mCnc: 200 mg/dL
Hgb A1c MFr Bld: 8.6 % — ABNORMAL HIGH (ref 4.8–5.6)

## 2022-04-19 LAB — LIPID PANEL
Chol/HDL Ratio: 3.1 ratio (ref 0.0–5.0)
Cholesterol, Total: 154 mg/dL (ref 100–199)
HDL: 50 mg/dL (ref >39)
LDL Chol Calc (NIH): 91 mg/dL (ref 0–99)
Triglycerides: 66 mg/dL (ref 0–149)
VLDL Cholesterol Cal: 13 mg/dL (ref 5–40)

## 2022-04-19 NOTE — Progress Notes (Signed)
Hi Aaron Wood. It was nice to see you yesterday.  Your lab work looks good.  Your A1c did increase to 8.6.  This is likely due to you being out of the [place].  Now that you have it back we will continue with your current regimen and follow up in 3 months to see where you are at.  Make sure to decrease carbohydrate intake to help with your A1c.  No other concerns at this time. Continue with your current medication regimen.  Follow up as discussed.  Please let me know if you have any questions.

## 2022-06-20 ENCOUNTER — Ambulatory Visit: Payer: BC Managed Care – PPO | Admitting: Nurse Practitioner

## 2022-07-17 NOTE — Progress Notes (Unsigned)
There were no vitals taken for this visit.   Subjective:    Patient ID: Aaron Wood, male    DOB: 05/02/1975, 47 y.o.   MRN: [id number]  HPI: Aaron Wood is a 47 y.o. male presenting on 07/18/2022 for comprehensive medical examination. Current medical complaints include:none  He currently lives with: Interim Problems from his last visit: no  DIABETES Hypoglycemic episodes:no Polydipsia/polyuria: no Visual disturbance: no Chest pain: no Paresthesias: no Glucose Monitoring: no  Accucheck frequency: Not Checking  Fasting glucose:  Post prandial:  Evening:  Before meals: Taking Insulin?: no  Long acting insulin:  Short acting insulin: Blood Pressure Monitoring: not checking Retinal Examination:  Discussed at visit.  Needs to schedule Foot Exam:  Done Today Diabetic Education: Not Completed Pneumovax:  Refused Influenza:  Given today Aspirin: no  HYPERLIPIDEMIA Hyperlipidemia status: excellent compliance Satisfied with current treatment?  no Side effects:  no Medication compliance: excellent compliance Past cholesterol meds: atorvastain (lipitor) Supplements: none Aspirin:  no The 10-year ASCVD risk score (Arnett DK, et al., 2019) is: 2.6%   Values used to calculate the score:     Age: 13 years     Sex: Male     Is Non-Hispanic African American: No     Diabetic: Yes     Tobacco smoker: No     Systolic Blood Pressure: [id number] mmHg     Is BP treated: No     HDL Cholesterol: 50 mg/dL     Total Cholesterol: 154 mg/dL Chest pain:  no Coronary artery disease:  no Family history CAD:  no Family history early CAD:  no  ED Patient states that he is having a problem with ED.  Wondering if it is related to medications or age.   Depression Screen done today and results listed below:     04/18/2022    9:51 AM 09/15/2021    9:27 AM 03/14/2021    8:15 AM 03/14/2021    8:14 AM 12/12/2020    8:37 AM  Depression screen PHQ 2/9  Decreased Interest 0 0 0 0 0  Down,  Depressed, Hopeless 0 0 0 0 0  PHQ - 2 Score 0 0 0 0 0  Altered sleeping 0 0 1    Tired, decreased energy 0 0 0    Change in appetite 0 0 0    Feeling bad or failure about yourself  0 0 0    Trouble concentrating 0 0 0    Moving slowly or fidgety/restless 0 0 0    Suicidal thoughts 0 0 0    PHQ-9 Score 0 0 1    Difficult doing work/chores Not difficult at all Not difficult at all Not difficult at all      The patient does not have a history of falls. I did complete a risk assessment for falls. A plan of care for falls was documented.   Past Medical History:  Past Medical History:  Diagnosis Date  . Diabetes mellitus without complication     Surgical History:  No past surgical history on file.  Medications:  Current Outpatient Medications on File Prior to Visit  Medication Sig  . atorvastatin (LIPITOR) 20 MG tablet Take 1 tablet (20 mg total) by mouth daily.  . dapagliflozin propanediol (FARXIGA) 10 MG TABS tablet Take 1 tablet (10 mg total) by mouth daily.  . metFORMIN (GLUCOPHAGE) 1000 MG tablet TAKE 1 TABLET (1,000 MG TOTAL) BY MOUTH TWICE A DAY WITH FOOD  . Semaglutide,  1 MG/DOSE, 4 MG/3ML SOPN Inject 1 mg as directed once a week.  . sildenafil (VIAGRA) 50 MG tablet Take 1 tablet (50 mg total) by mouth daily as needed for erectile dysfunction.   No current facility-administered medications on file prior to visit.    Allergies:  No Known Allergies  Social History:  Social History   Socioeconomic History  . Marital status: Married    Spouse name: Not on file  . Number of children: Not on file  . Years of education: Not on file  . Highest education level: 12th grade  Occupational History  . Not on file  Tobacco Use  . Smoking status: Never  . Smokeless tobacco: Never  Vaping Use  . Vaping Use: Never used  Substance and Sexual Activity  . Alcohol use: Yes    Comment: occasionally  . Drug use: Never  . Sexual activity: Yes  Other Topics Concern  . Not on  file  Social History Narrative  . Not on file   Social Determinants of Health   Financial Resource Strain: Low Risk  (07/17/2022)   Overall Financial Resource Strain (CARDIA)   . Difficulty of Paying Living Expenses: Not hard at all  Food Insecurity: No Food Insecurity (07/17/2022)   Hunger Vital Sign   . Worried About Charity fundraiser in the Last Year: Never true   . Ran Out of Food in the Last Year: Never true  Transportation Needs: No Transportation Needs (07/17/2022)   PRAPARE - Transportation   . Lack of Transportation (Medical): No   . Lack of Transportation (Non-Medical): No  Physical Activity: Insufficiently Active (07/17/2022)   Exercise Vital Sign   . Days of Exercise per Week: 4 days   . Minutes of Exercise per Session: 30 min  Stress: No Stress Concern Present (07/17/2022)   [place]   . Feeling of Stress : Not at all  Social Connections: Socially Integrated (07/17/2022)   Social Connection and Isolation Panel [NHANES]   . Frequency of Communication with Friends and Family: Three times a week   . Frequency of Social Gatherings with Friends and Family: Twice a week   . Attends Religious Services: More than 4 times per year   . Active Member of Clubs or Organizations: Yes   . Attends Archivist Meetings: More than 4 times per year   . Marital Status: Married  Human resources officer Violence: Not on file   Social History   Tobacco Use  Smoking Status Never  Smokeless Tobacco Never   Social History   Substance and Sexual Activity  Alcohol Use Yes   Comment: occasionally    Family History:  No family history on file.  Past medical history, surgical history, medications, allergies, family history and social history reviewed with patient today and changes made to appropriate areas of the chart.   Review of Systems  HENT:         Denies vision changes.  Eyes:  Negative for blurred vision and  double vision.  Respiratory:  Negative for shortness of breath.   Cardiovascular:  Negative for chest pain, palpitations and leg swelling.  Neurological:  Negative for dizziness, tingling and headaches.  Endo/Heme/Allergies:  Negative for polydipsia.       Denies Polyuria  All other ROS negative except what is listed above and in the HPI.      Objective:    There were no vitals taken for this visit.  Wt Readings from Last 3 Encounters:  04/18/22 169 lb 11.2 oz (77 kg)  12/20/21 161 lb 4.8 oz (73.2 kg)  09/15/21 161 lb 6.4 oz (73.2 kg)    Physical Exam Vitals and nursing note reviewed.  Constitutional:      General: He is not in acute distress.    Appearance: Normal appearance. He is not ill-appearing, toxic-appearing or diaphoretic.  HENT:     Head: Normocephalic.     Right Ear: Tympanic membrane, ear canal and external ear normal.     Left Ear: Tympanic membrane, ear canal and external ear normal.     Nose: Nose normal. No congestion or rhinorrhea.     Mouth/Throat:     Mouth: Mucous membranes are moist.  Eyes:     General:        Right eye: No discharge.        Left eye: No discharge.     Extraocular Movements: Extraocular movements intact.     Conjunctiva/sclera: Conjunctivae normal.     Pupils: Pupils are equal, round, and reactive to light.  Cardiovascular:     Rate and Rhythm: Normal rate and regular rhythm.     Heart sounds: No murmur heard. Pulmonary:     Effort: Pulmonary effort is normal. No respiratory distress.     Breath sounds: Normal breath sounds. No wheezing, rhonchi or rales.  Abdominal:     General: Abdomen is flat. Bowel sounds are normal. There is no distension.     Palpations: Abdomen is soft.     Tenderness: There is no abdominal tenderness. There is no guarding.  Musculoskeletal:     Cervical back: Normal range of motion and neck supple.  Skin:    General: Skin is warm and dry.     Capillary Refill: Capillary refill takes less than 2 seconds.   Neurological:     General: No focal deficit present.     Mental Status: He is alert and oriented to person, place, and time.     Cranial Nerves: No cranial nerve deficit.     Motor: No weakness.     Deep Tendon Reflexes: Reflexes normal.  Psychiatric:        Mood and Affect: Mood normal.        Behavior: Behavior normal.        Thought Content: Thought content normal.        Judgment: Judgment normal.    Results for orders placed or performed in visit on 04/18/22  Comp Met (CMET)  Result Value Ref Range   Glucose 125 (H) 70 - 99 mg/dL   BUN 14 6 - 24 mg/dL   Creatinine, Ser 0.98 0.76 - 1.27 mg/dL   eGFR 96 >59 mL/min/1.73   BUN/Creatinine Ratio 14 9 - 20   Sodium 137 134 - 144 mmol/L   Potassium 4.6 3.5 - 5.2 mmol/L   Chloride 100 96 - 106 mmol/L   CO2 22 20 - 29 mmol/L   Calcium 9.6 8.7 - 10.2 mg/dL   Total Protein 6.9 6.0 - 8.5 g/dL   Albumin 4.5 4.1 - 5.1 g/dL   Globulin, Total 2.4 1.5 - 4.5 g/dL   Albumin/Globulin Ratio 1.9 1.2 - 2.2   Bilirubin Total 0.4 0.0 - 1.2 mg/dL   Alkaline Phosphatase 63 44 - 121 IU/L   AST 15 0 - 40 IU/L   ALT 20 0 - 44 IU/L  Lipid Profile  Result Value Ref Range   Cholesterol, Total 154 100 -  199 mg/dL   Triglycerides 66 0 - 149 mg/dL   HDL 50 >39 mg/dL   VLDL Cholesterol Cal 13 5 - 40 mg/dL   LDL Chol Calc (NIH) 91 0 - 99 mg/dL   Chol/HDL Ratio 3.1 0.0 - 5.0 ratio  HgB A1c  Result Value Ref Range   Hgb A1c MFr Bld 8.6 (H) 4.8 - 5.6 %   Est. average glucose Bld gHb Est-mCnc 200 mg/dL      Assessment & Plan:   Problem List Items Addressed This Visit      Endocrine   Type 2 diabetes mellitus without obesity - Primary   Hyperlipidemia associated with type 2 diabetes mellitus     Discussed aspirin prophylaxis for myocardial infarction prevention and decision was it was not indicated  LABORATORY TESTING:  Health maintenance labs ordered today as discussed above.     IMMUNIZATIONS:   - Tdap: Tetanus vaccination status  reviewed: last tetanus booster within 10 years. - Influenza: Administered today - Pneumovax: Refused - Prevnar: Not applicable - COVID: Up to date - HPV: Not applicable - Shingrix vaccine: Not applicable  SCREENING: - Colonoscopy:  Cologuard ordered today   Discussed with patient purpose of the colonoscopy is to detect colon cancer at curable precancerous or early stages   - AAA Screening: Not applicable  -Hearing Test: Not applicable  -Spirometry: Not applicable   PATIENT COUNSELING:    Sexuality: Discussed sexually transmitted diseases, partner selection, use of condoms, avoidance of unintended pregnancy  and contraceptive alternatives.   Advised to avoid cigarette smoking.  I discussed with the patient that most people either abstain from alcohol or drink within safe limits (<=14/week and <=4 drinks/occasion for males, <=7/weeks and <= 3 drinks/occasion for females) and that the risk for alcohol disorders and other health effects rises proportionally with the number of drinks per week and how often a drinker exceeds daily limits.  Discussed cessation/primary prevention of drug use and availability of treatment for abuse.   Diet: Encouraged to adjust caloric intake to maintain  or achieve ideal body weight, to reduce intake of dietary saturated fat and total fat, to limit sodium intake by avoiding high sodium foods and not adding table salt, and to maintain adequate dietary potassium and calcium preferably from fresh fruits, vegetables, and low-fat dairy products.    stressed the importance of regular exercise  Injury prevention: Discussed safety belts, safety helmets, smoke detector, smoking near bedding or upholstery.   Dental health: Discussed importance of regular tooth brushing, flossing, and dental visits.   Follow up plan: NEXT PREVENTATIVE PHYSICAL DUE IN 1 YEAR. No follow-ups on file.

## 2022-07-18 ENCOUNTER — Ambulatory Visit: Payer: BC Managed Care – PPO | Admitting: Nurse Practitioner

## 2022-07-18 ENCOUNTER — Encounter: Payer: Self-pay | Admitting: Nurse Practitioner

## 2022-07-18 VITALS — BP 127/78 | HR 85 | Temp 97.7°F | Ht 67.01 in | Wt 163.6 lb

## 2022-07-18 DIAGNOSIS — N529 Male erectile dysfunction, unspecified: Secondary | ICD-10-CM | POA: Diagnosis not present

## 2022-07-18 DIAGNOSIS — E785 Hyperlipidemia, unspecified: Secondary | ICD-10-CM

## 2022-07-18 DIAGNOSIS — E1169 Type 2 diabetes mellitus with other specified complication: Secondary | ICD-10-CM | POA: Diagnosis not present

## 2022-07-18 DIAGNOSIS — Z0000 Encounter for general adult medical examination without abnormal findings: Secondary | ICD-10-CM | POA: Diagnosis not present

## 2022-07-18 DIAGNOSIS — E291 Testicular hypofunction: Secondary | ICD-10-CM | POA: Diagnosis not present

## 2022-07-18 DIAGNOSIS — E119 Type 2 diabetes mellitus without complications: Secondary | ICD-10-CM

## 2022-07-18 DIAGNOSIS — B351 Tinea unguium: Secondary | ICD-10-CM | POA: Diagnosis not present

## 2022-07-18 DIAGNOSIS — R5383 Other fatigue: Secondary | ICD-10-CM | POA: Diagnosis not present

## 2022-07-18 LAB — URINALYSIS, ROUTINE W REFLEX MICROSCOPIC
Bilirubin, UA: NEGATIVE
Leukocytes,UA: NEGATIVE
Nitrite, UA: NEGATIVE
Protein,UA: NEGATIVE
RBC, UA: NEGATIVE
Specific Gravity, UA: 1.015 (ref 1.005–1.030)
Urobilinogen, Ur: 0.2 mg/dL (ref 0.2–1.0)
pH, UA: 5 (ref 5.0–7.5)

## 2022-07-18 NOTE — Assessment & Plan Note (Signed)
Chronic.  Controlled.  Last A1c 8.6%.  Continue with current medication regimen of metformin 1000mg  BID, Ozempic 1mg  weekly, Farxiga 10mg  daily.  Labs ordered today.  Return to clinic in 3 months for reevaluation.  Call sooner if concerns arise.

## 2022-07-18 NOTE — Assessment & Plan Note (Signed)
Chronic.  Controlled.  Continue with current medication regimen of Atorvastatin daily.  Labs ordered today.  Return to clinic in 3 months for reevaluation.  Call sooner if concerns arise.   

## 2022-07-19 ENCOUNTER — Encounter: Payer: Self-pay | Admitting: Nurse Practitioner

## 2022-07-19 MED ORDER — SEMAGLUTIDE (2 MG/DOSE) 8 MG/3ML ~~LOC~~ SOPN: 2.0000 mg | PEN_INJECTOR | SUBCUTANEOUS | 1 refills | Status: DC

## 2022-07-19 NOTE — Progress Notes (Signed)
Hi Savon.  Your A1c increased to 9.4%.  I recommend she increase the Ozempic to 2mg .  If you agree, I will send this to the pharmacy.  I also recommend modifying your diet.  Avoid sugary drinks such as sweet tea, sodas, and Kool Aid.  I also recommend a low carb diet.  Your testosterone is well within the normal range.  No other concerns at this time. Follow up as discussed.

## 2022-07-21 LAB — CBC WITH DIFFERENTIAL/PLATELET
Basophils Absolute: 0.1 10*3/uL (ref 0.0–0.2)
Basos: 1 %
EOS (ABSOLUTE): 0.3 10*3/uL (ref 0.0–0.4)
Eos: 5 %
Hematocrit: 48.1 % (ref 37.5–51.0)
Hemoglobin: 16.1 g/dL (ref 13.0–17.7)
Immature Grans (Abs): 0 10*3/uL (ref 0.0–0.1)
Immature Granulocytes: 0 %
Lymphocytes Absolute: 1.8 10*3/uL (ref 0.7–3.1)
Lymphs: 28 %
MCH: 28.9 pg (ref 26.6–33.0)
MCHC: 33.5 g/dL (ref 31.5–35.7)
MCV: 86 fL (ref 79–97)
Monocytes Absolute: 0.5 10*3/uL (ref 0.1–0.9)
Monocytes: 7 %
Neutrophils Absolute: 4 10*3/uL (ref 1.4–7.0)
Neutrophils: 59 %
Platelets: 322 10*3/uL (ref 150–450)
RBC: 5.57 x10E6/uL (ref 4.14–5.80)
RDW: 13.2 % (ref 11.6–15.4)
WBC: 6.7 10*3/uL (ref 3.4–10.8)

## 2022-07-21 LAB — COMPREHENSIVE METABOLIC PANEL
ALT: 25 IU/L (ref 0–44)
AST: 24 IU/L (ref 0–40)
Albumin/Globulin Ratio: 1.8 (ref 1.2–2.2)
Albumin: 4.6 g/dL (ref 4.1–5.1)
Alkaline Phosphatase: 75 IU/L (ref 44–121)
BUN/Creatinine Ratio: 16 (ref 9–20)
BUN: 17 mg/dL (ref 6–24)
Bilirubin Total: 0.3 mg/dL (ref 0.0–1.2)
CO2: 22 mmol/L (ref 20–29)
Calcium: 10.1 mg/dL (ref 8.7–10.2)
Chloride: 102 mmol/L (ref 96–106)
Creatinine, Ser: 1.04 mg/dL (ref 0.76–1.27)
Globulin, Total: 2.6 g/dL (ref 1.5–4.5)
Glucose: 147 mg/dL — ABNORMAL HIGH (ref 70–99)
Potassium: 4.7 mmol/L (ref 3.5–5.2)
Sodium: 141 mmol/L (ref 134–144)
Total Protein: 7.2 g/dL (ref 6.0–8.5)
eGFR: 89 mL/min/{1.73_m2} (ref >59)

## 2022-07-21 LAB — LIPID PANEL
Chol/HDL Ratio: 3.6 ratio (ref 0.0–5.0)
Cholesterol, Total: 166 mg/dL (ref 100–199)
HDL: 46 mg/dL (ref >39)
LDL Chol Calc (NIH): 104 mg/dL — ABNORMAL HIGH (ref 0–99)
Triglycerides: 83 mg/dL (ref 0–149)
VLDL Cholesterol Cal: 16 mg/dL (ref 5–40)

## 2022-07-21 LAB — TSH: TSH: 1.32 u[IU]/mL (ref 0.450–4.500)

## 2022-07-21 LAB — HEMOGLOBIN A1C
Est. average glucose Bld gHb Est-mCnc: 223 mg/dL
Hgb A1c MFr Bld: 9.4 % — ABNORMAL HIGH (ref 4.8–5.6)

## 2022-07-21 LAB — TESTOSTERONE, FREE, TOTAL, SHBG
Sex Hormone Binding: 28.7 nmol/L (ref 16.5–55.9)
Testosterone, Free: 11 pg/mL (ref 6.8–21.5)
Testosterone: 522 ng/dL (ref 264–916)

## 2022-08-21 ENCOUNTER — Ambulatory Visit: Payer: BC Managed Care – PPO | Admitting: Podiatry

## 2022-08-21 VITALS — BP 123/79

## 2022-08-21 DIAGNOSIS — B351 Tinea unguium: Secondary | ICD-10-CM | POA: Diagnosis not present

## 2022-08-21 MED ORDER — TERBINAFINE HCL 250 MG PO TABS: 250.0000 mg | ORAL_TABLET | Freq: Every day | ORAL | 0 refills | Status: DC

## 2022-08-21 NOTE — Progress Notes (Signed)
   Chief Complaint  Patient presents with   Nail Problem    Patient came in today for bilateral nail fungus,right hallux and 5th toe are the worse, started 7 months ago,patient denies any pain, nails are yellow and thick,  patient is diabetic A1c- 9.4 BG- not taking     Subjective: 47 y.o. male presenting today as a new patient for evaluation of discoloration with thickening to the bilateral great toes and the right fifth digit.  This is been ongoing for about 1 year now.  Concern for toenail fungus.  Referred by his PCP.  He is not on anything for treatment  Past Medical History:  Diagnosis Date   Diabetes mellitus without complication (HCC)     No past surgical history on file.  No Known Allergies   B/L toes 08/21/2022  Objective: Physical Exam General: The patient is alert and oriented x3 in no acute distress.  Dermatology: Hyperkeratotic, discolored, thickened, onychodystrophy noted bilateral great toes and right fifth digit. Skin is warm, dry and supple bilateral lower extremities. Negative for open lesions or macerations.  Vascular: Palpable pedal pulses bilaterally. No edema or erythema noted. Capillary refill within normal limits.  Neurological: Epicritic and protective threshold grossly intact bilaterally.   Musculoskeletal Exam: No pedal deformity noted  Assessment: #1 Onychomycosis of toenails bilateral  Plan of Care:  #1 Patient was evaluated. #2  Today we discussed different treatment options including oral, topical, and laser antifungal treatment modalities.  We discussed their efficacies and side effects.  Patient opts for oral antifungal treatment modality #3 prescription for Lamisil 250 mg #90 daily. Pt denies a history of liver pathology or symptoms.  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 Felecia Shelling,  DPM    [address] [place], [place] [address]                Office [phone]  Fax [phone]

## 2022-10-08 ENCOUNTER — Other Ambulatory Visit: Payer: Self-pay | Admitting: Nurse Practitioner

## 2022-10-09 NOTE — Telephone Encounter (Signed)
Requested Prescriptions  Pending Prescriptions Disp Refills   FARXIGA 10 MG TABS tablet [Pharmacy Med Name: FARXIGA 10 MG TABLET] 90 tablet 0    Sig: TAKE 1 TABLET BY MOUTH EVERY DAY     Endocrinology:  Diabetes - SGLT2 Inhibitors Failed - 10/08/2022  6:08 PM      Failed - HBA1C is between 0 and 7.9 and within 180 days    HB A1C (BAYER DCA - WAIVED)  Date Value Ref Range Status  03/01/2020 7.0 (H) <7.0 % Final    Comment:                                          Diabetic Adult            <7.0                                       Healthy Adult        4.3 - 5.7                                                           (DCCT/NGSP) American Diabetes Association's Summary of Glycemic Recommendations for Adults with Diabetes: Hemoglobin A1c <7.0%. More stringent glycemic goals (A1c <6.0%) may further reduce complications at the cost of increased risk of hypoglycemia.    Hgb A1c MFr Bld  Date Value Ref Range Status  07/18/2022 9.4 (H) 4.8 - 5.6 % Final    Comment:             Prediabetes: 5.7 - 6.4          Diabetes: >6.4          Glycemic control for adults with diabetes: <7.0          Passed - Cr in normal range and within 360 days    Creatinine, Ser  Date Value Ref Range Status  07/18/2022 1.04 0.76 - 1.27 mg/dL Final         Passed - eGFR in normal range and within 360 days    GFR calc Af Amer  Date Value Ref Range Status  03/01/2020 106 >59 mL/min/1.73 Final    Comment:    **In accordance with recommendations from the NKF-ASN Task force,**   [facility] is in the process of updating its eGFR calculation to the   2021 CKD-EPI creatinine equation that estimates kidney function   without a race variable.    GFR, Estimated  Date Value Ref Range Status  04/04/2020 >60 >60 mL/min Final    Comment:    (NOTE) Calculated using the CKD-EPI Creatinine Equation (2021)    eGFR  Date Value Ref Range Status  07/18/2022 89 >59 mL/min/1.73 Final         Passed - Valid encounter  within last 6 months    Recent Outpatient Visits           2 months ago Annual physical exam   [place] [facility] Larae Grooms, NP   5 months ago Hyperlipidemia associated with type 2 diabetes mellitus [facility])   [place] [facility] Larae Grooms, NP  9 months ago Hyperlipidemia associated with type 2 diabetes mellitus [facility])   [place] [facility] Larae Grooms, NP   1 year ago Hyperlipidemia associated with type 2 diabetes mellitus (HCC)   [place] [facility] Larae Grooms, NP   1 year ago Annual physical exam   [place] [facility] Larae Grooms, NP       Future Appointments             In 1 week Larae Grooms, NP [facility]             metFORMIN (GLUCOPHAGE) 1000 MG tablet [Pharmacy Med Name: METFORMIN HCL 1,000 MG TABLET] 180 tablet 0    Sig: TAKE 1 TABLET (1,000 MG TOTAL) BY MOUTH TWICE A DAY WITH FOOD     Endocrinology:  Diabetes - Biguanides Failed - 10/08/2022  6:08 PM      Failed - HBA1C is between 0 and 7.9 and within 180 days    HB A1C (BAYER DCA - WAIVED)  Date Value Ref Range Status  03/01/2020 7.0 (H) <7.0 % Final    Comment:                                          Diabetic Adult            <7.0                                       Healthy Adult        4.3 - 5.7                                                           (DCCT/NGSP) American Diabetes Association's Summary of Glycemic Recommendations for Adults with Diabetes: Hemoglobin A1c <7.0%. More stringent glycemic goals (A1c <6.0%) may further reduce complications at the cost of increased risk of hypoglycemia.    Hgb A1c MFr Bld  Date Value Ref Range Status  07/18/2022 9.4 (H) 4.8 - 5.6 % Final    Comment:             Prediabetes: 5.7 - 6.4          Diabetes: >6.4          Glycemic control for adults with diabetes: <7.0          Failed - B12 Level in normal  range and within 720 days    Vitamin B-12  Date Value Ref Range Status  03/01/2020 445 232 - 1,245 pg/mL Final         Passed - Cr in normal range and within 360 days    Creatinine, Ser  Date Value Ref Range Status  07/18/2022 1.04 0.76 - 1.27 mg/dL Final         Passed - eGFR in normal range and within 360 days    GFR calc Af Amer  Date Value Ref Range Status  03/01/2020 106 >59 mL/min/1.73 Final    Comment:    **In accordance with recommendations from the NKF-ASN Task force,**   [facility] is in the process of  updating its eGFR calculation to the   2021 CKD-EPI creatinine equation that estimates kidney function   without a race variable.    GFR, Estimated  Date Value Ref Range Status  04/04/2020 >60 >60 mL/min Final    Comment:    (NOTE) Calculated using the CKD-EPI Creatinine Equation (2021)    eGFR  Date Value Ref Range Status  07/18/2022 89 >59 mL/min/1.73 Final         Passed - Valid encounter within last 6 months    Recent Outpatient Visits           2 months ago Annual physical exam   [place] [facility] Larae Grooms, NP   5 months ago Hyperlipidemia associated with type 2 diabetes mellitus (HCC)   [place] [facility] Larae Grooms, NP   9 months ago Hyperlipidemia associated with type 2 diabetes mellitus (HCC)   [place] [facility] Larae Grooms, NP   1 year ago Hyperlipidemia associated with type 2 diabetes mellitus (HCC)   [place] [facility] Larae Grooms, NP   1 year ago Annual physical exam   [facility] G[facility], NP       Future Appointments             In 1 week Larae Grooms, NP [facility]            Passed - CBC within normal limits and completed in the last 12 months    WBC  Date Value Ref Range Status  07/18/2022 6.7 3.4 - 10.8 x10E3/uL Final  04/04/2020 8.8 4.0 - 10.5 K/uL Final    RBC  Date Value Ref Range Status  07/18/2022 5.57 4.14 - 5.80 x10E6/uL Final  04/04/2020 5.29 4.22 - 5.81 MIL/uL Final   Hemoglobin  Date Value Ref Range Status  07/18/2022 16.1 13.0 - 17.7 g/dL Final   Total hemoglobin  Date Value Ref Range Status  04/04/2020 16.3 (H) 12.0 - 16.0 g/dL Final    Comment:    CRITICAL RESULT CALLED TO, READ BACK BY AND VERIFIED WITH: [place], RN AT 1915 ON [id number] [place]    Hematocrit  Date Value Ref Range Status  07/18/2022 48.1 37.5 - 51.0 % Final   MCHC  Date Value Ref Range Status  07/18/2022 33.5 31.5 - 35.7 g/dL Final  63/04/6008 93.2 30.0 - 36.0 g/dL Final   [facility]  Date Value Ref Range Status  07/18/2022 28.9 26.6 - 33.0 pg Final  04/04/2020 29.5 26.0 - 34.0 pg Final   MCV  Date Value Ref Range Status  07/18/2022 86 79 - 97 fL Final   No results found for: "PLTCOUNTKUC", "LABPLAT", "POCPLA" RDW  Date Value Ref Range Status  07/18/2022 13.2 11.6 - 15.4 % Final          atorvastatin (LIPITOR) 20 MG tablet [Pharmacy Med Name: ATORVASTATIN 20 MG TABLET] 90 tablet 0    Sig: TAKE 1 TABLET BY MOUTH EVERY DAY     Cardiovascular:  Antilipid - Statins Failed - 10/08/2022  6:08 PM      Failed - Lipid Panel in normal range within the last 12 months    Cholesterol, Total  Date Value Ref Range Status  07/18/2022 166 100 - 199 mg/dL Final   Cholesterol Piccolo, Waived  Date Value Ref Range Status  08/07/2019 157 <200 mg/dL Final    Comment:  Desirable                <200                         Borderline High      200- 239                         High                     >239    LDL Chol Calc (NIH)  Date Value Ref Range Status  07/18/2022 104 (H) 0 - 99 mg/dL Final   HDL  Date Value Ref Range Status  07/18/2022 46 >39 mg/dL Final   Triglycerides  Date Value Ref Range Status  07/18/2022 83 0 - 149 mg/dL Final   Triglycerides Piccolo,Waived  Date Value Ref Range Status  08/07/2019 106 <150  mg/dL Final    Comment:                            Normal                   <150                         Borderline High     150 - 199                         High                200 - 499                         Very High                >499          Passed - Patient is not pregnant      Passed - Valid encounter within last 12 months    Recent Outpatient Visits           2 months ago Annual physical exam   [place] [facility] Larae Grooms, NP   5 months ago Hyperlipidemia associated with type 2 diabetes mellitus [facility])   [place] [facility] Larae Grooms, NP   9 months ago Hyperlipidemia associated with type 2 diabetes mellitus [facility])   [place] [facility] Larae Grooms, NP   1 year ago Hyperlipidemia associated with type 2 diabetes mellitus (HCC)   [place] [facility] Larae Grooms, NP   1 year ago Annual physical exam   [place] [facility] Larae Grooms, NP       Future Appointments             In 1 week Larae Grooms, NP [place] [facility]             sildenafil (VIAGRA) 50 MG tablet [Pharmacy Med Name: SILDENAFIL 50 MG TABLET] 10 tablet 3    Sig: TAKE 1 TABLET BY MOUTH DAILY AS NEEDED FOR ERECTILE DYSFUNCTION     Urology: Erectile Dysfunction Agents Passed - 10/08/2022  6:08 PM      Passed - AST in normal range and within 360 days    AST  Date Value Ref Range Status  07/18/2022 24 0 -  40 IU/L Final   AST (SGOT) Piccolo, Waived  Date Value Ref Range Status  05/26/2018 20 11 - 38 U/L Final         Passed - ALT in normal range and within 360 days    ALT  Date Value Ref Range Status  07/18/2022 25 0 - 44 IU/L Final   ALT (SGPT) Piccolo, Waived  Date Value Ref Range Status  05/26/2018 16 10 - 47 U/L Final         Passed - Last BP in normal range    BP Readings from Last 1 Encounters:  08/21/22 123/79         Passed -  Valid encounter within last 12 months    Recent Outpatient Visits           2 months ago Annual physical exam   [place] [facility] Larae Grooms, NP   5 months ago Hyperlipidemia associated with type 2 diabetes mellitus [facility])   [place] [facility] Larae Grooms, NP   9 months ago Hyperlipidemia associated with type 2 diabetes mellitus [facility])   [place] [facility] Larae Grooms, NP   1 year ago Hyperlipidemia associated with type 2 diabetes mellitus (HCC)   [place] [facility] Larae Grooms, NP   1 year ago Annual physical exam   [place] [facility] Larae Grooms, NP       Future Appointments             In 1 week Larae Grooms, NP  [facility], [facility]

## 2022-10-17 ENCOUNTER — Ambulatory Visit: Payer: BC Managed Care – PPO | Admitting: Nurse Practitioner

## 2022-10-24 ENCOUNTER — Encounter: Payer: Self-pay | Admitting: Physician Assistant

## 2022-10-24 ENCOUNTER — Ambulatory Visit: Payer: BC Managed Care – PPO | Admitting: Physician Assistant

## 2022-10-24 VITALS — BP 109/66 | HR 88 | Temp 98.1°F | Ht 67.0 in | Wt 161.4 lb

## 2022-10-24 DIAGNOSIS — E785 Hyperlipidemia, unspecified: Secondary | ICD-10-CM | POA: Diagnosis not present

## 2022-10-24 DIAGNOSIS — E119 Type 2 diabetes mellitus without complications: Secondary | ICD-10-CM | POA: Diagnosis not present

## 2022-10-24 DIAGNOSIS — E1169 Type 2 diabetes mellitus with other specified complication: Secondary | ICD-10-CM | POA: Diagnosis not present

## 2022-10-24 LAB — BAYER DCA HB A1C WAIVED: HB A1C (BAYER DCA - WAIVED): 7.9 % — ABNORMAL HIGH (ref 4.8–5.6)

## 2022-10-24 NOTE — Assessment & Plan Note (Addendum)
Chronic, historic condition Currently taking Ozempic 2.0 mg weekly injection, Metformin 1000 mg PO BID, farxiga 10 mg PO every day  He reports tolerating this well A1c rechecked today: 7.9% improved from previous, reviewed with him during apt He is taking statin and we reviewed importance of annual eye exam for retinopathy screening  Continue current regimen today- stressed the importance of reducing excess sugar and carbs to provide further control If A1c is still above goal at follow up, will likely need to add another medication for optimal management  Follow up in 3 months

## 2022-10-24 NOTE — Progress Notes (Signed)
Established Patient Office Visit  Name: Aaron Wood   MRN: [id number]    DOB: July 06, 1975   Date:10/24/2022  Today's Provider: Jacquelin Hawking, MHS, PA-C Introduced myself to the patient as a PA-C and provided education on APPs in clinical practice.         Subjective  Chief Complaint  Chief Complaint  Patient presents with   Hypertension   Diabetes    Patient declines having a recent Diabetic Eye Exam.    Hyperlipidemia    HPI  Diabetes, Type 2 - Last A1c 9.4 - Medications: Ozempic 2 mg weekly injection, Farxiga 10 mg po every day, Metformin 1000 mg pO BID  - Compliance: good  - Checking BG at home: not checking  - Diet: He is not following any dietary measures for diabetes  - Exercise: he reports he does cardio and weights at work about 3 times per week for about an hour each session   - Eye exam: He has not scheduled eye exam- importance of regular screening discussed  - Foot exam: UTD - Microalbumin: UTD - Statin: on statin therapy  - PNA vaccine: NA - Denies symptoms of hypoglycemia, polyuria, polydipsia, numbness extremities, foot ulcers/trauma   HYPERLIPIDEMIA  Hyperlipidemia status: good compliance Satisfied with current treatment?  yes Side effects:  no Medication compliance: good compliance Past cholesterol meds: atorvastain (lipitor) Supplements: none Aspirin:  no The 10-year ASCVD risk score (Arnett DK, et al., 2019) is: 3.4%   Values used to calculate the score:     Age: 47 years     Sex: Male     Is Non-Hispanic African American: No     Diabetic: Yes     Tobacco smoker: No     Systolic Blood Pressure: 109 mmHg     Is BP treated: No     HDL Cholesterol: 46 mg/dL     Total Cholesterol: 166 mg/dL Chest pain:  no   Patient Active Problem List   Diagnosis Date Noted   Type 2 diabetes mellitus without obesity (HCC) 07/11/2017   Hyperlipidemia associated with type 2 diabetes mellitus (HCC) 07/11/2017   OSA (obstructive sleep apnea)  07/11/2017    History reviewed. No pertinent surgical history.  History reviewed. No pertinent family history.  Social History   Tobacco Use   Smoking status: Never   Smokeless tobacco: Never  Substance Use Topics   Alcohol use: Yes    Comment: occasionally     Current Outpatient Medications:    atorvastatin (LIPITOR) 20 MG tablet, TAKE 1 TABLET BY MOUTH EVERY DAY, Disp: 90 tablet, Rfl: 0   dapagliflozin propanediol (FARXIGA) 10 MG TABS tablet, TAKE 1 TABLET BY MOUTH EVERY DAY, Disp: 90 tablet, Rfl: 0   metFORMIN (GLUCOPHAGE) 1000 MG tablet, TAKE 1 TABLET (1,000 MG TOTAL) BY MOUTH TWICE A DAY WITH FOOD, Disp: 180 tablet, Rfl: 0   Semaglutide, 2 MG/DOSE, 8 MG/3ML SOPN, Inject 2 mg as directed once a week., Disp: 6 mL, Rfl: 1   sildenafil (VIAGRA) 50 MG tablet, TAKE 1 TABLET BY MOUTH DAILY AS NEEDED FOR ERECTILE DYSFUNCTION, Disp: 10 tablet, Rfl: 3   terbinafine (LAMISIL) 250 MG tablet, Take 1 tablet (250 mg total) by mouth daily., Disp: 90 tablet, Rfl: 0  No Known Allergies  I personally reviewed active problem list, medication list, allergies, health maintenance, notes from last encounter, lab results with the patient/caregiver today.   Review of Systems  Eyes:  Negative for blurred  vision and double vision.  Respiratory:  Negative for shortness of breath and wheezing.   Cardiovascular:  Negative for chest pain, palpitations and leg swelling.  Neurological:  Negative for dizziness and headaches.  Endo/Heme/Allergies:  Negative for polydipsia.      Objective  Vitals:   10/24/22 0901  BP: 109/66  Pulse: 88  Temp: 98.1 F (36.7 C)  TempSrc: Oral  SpO2: 99%  Weight: 161 lb 6.4 oz (73.2 kg)  Height: 5\' 7"  (1.702 m)    Body mass index is 25.28 kg/m.  Physical Exam Vitals reviewed.  Constitutional:      General: He is awake.     Appearance: Normal appearance. He is well-developed and well-groomed.  HENT:     Head: Normocephalic and atraumatic.   Cardiovascular:     Rate and Rhythm: Normal rate and regular rhythm.     Pulses: Normal pulses.          Radial pulses are 2+ on the right side and 2+ on the left side.     Heart sounds: Normal heart sounds. No murmur heard.    No friction rub. No gallop.  Pulmonary:     Effort: Pulmonary effort is normal.     Breath sounds: Normal breath sounds. No decreased air movement. No decreased breath sounds, wheezing, rhonchi or rales.  Musculoskeletal:     Right lower leg: No edema.     Left lower leg: No edema.  Neurological:     Mental Status: He is alert.  Psychiatric:        Behavior: Behavior is cooperative.      No results found for this or any previous visit (from the past 2160 hour(s)).   PHQ2/9:    10/24/2022    9:05 AM 07/18/2022    8:26 AM 04/18/2022    9:51 AM 09/15/2021    9:27 AM 03/14/2021    8:15 AM  Depression screen PHQ 2/9  Decreased Interest 0 0 0 0 0  Down, Depressed, Hopeless 0 0 0 0 0  PHQ - 2 Score 0 0 0 0 0  Altered sleeping 0 0 0 0 1  Tired, decreased energy 0 0 0 0 0  Change in appetite 0 0 0 0 0  Feeling bad or failure about yourself  0 0 0 0 0  Trouble concentrating 0 0 0 0 0  Moving slowly or fidgety/restless 0 0 0 0 0  Suicidal thoughts 0 0 0 0 0  PHQ-9 Score 0 0 0 0 1  Difficult doing work/chores Not difficult at all Not difficult at all Not difficult at all Not difficult at all Not difficult at all      Fall Risk:    10/24/2022    9:05 AM 07/18/2022    8:26 AM 04/18/2022    9:50 AM 09/15/2021    9:27 AM 12/12/2020    8:37 AM  Fall Risk   Falls in the past year? 0 0 0 0 0  Number falls in past yr: 0 0 0 0 0  Injury with Fall? 0 0 0 0 0  Risk for fall due to : No Fall Risks No Fall Risks No Fall Risks No Fall Risks No Fall Risks  Follow up Falls evaluation completed  Falls evaluation completed Falls evaluation completed Falls evaluation completed      Functional Status Survey:      Assessment & Plan  Problem List Items Addressed This  Visit       Endocrine  Type 2 diabetes mellitus without obesity (HCC) - Primary    Chronic, historic condition Currently taking Ozempic 2.0 mg weekly injection, Metformin 1000 mg PO BID, farxiga 10 mg PO every day  He reports tolerating this well A1c rechecked today: 7.9% improved from previous, reviewed with him during apt He is taking statin and we reviewed importance of annual eye exam for retinopathy screening  Continue current regimen today- stressed the importance of reducing excess sugar and carbs to provide further control If A1c is still above goal at follow up, will likely need to add another medication for optimal management  Follow up in 3 months        Relevant Orders   Bayer DCA Hb A1c Waived (STAT)   Hyperlipidemia associated with type 2 diabetes mellitus (HCC)    Chronic, historic condition The 10-year ASCVD risk score (Arnett DK, et al., 2019) is: 3.4% He is taking Atorvastatin 20 mg pO every day and reports he is tolerating well Recheck labs in 3 months Continue current regimen for now  Follow up in 3 months or sooner if concerns arise         Return in about 3 months (around 01/24/2023) for Diabetes follow up, HLD, fasting labs .   I, Ferlando Lia E Christol Thetford, PA-C, have reviewed all documentation for this visit. The documentation on 10/24/22 for the exam, diagnosis, procedures, and orders are all accurate and complete.   Jacquelin Hawking, MHS, PA-C [facility]

## 2022-10-24 NOTE — Assessment & Plan Note (Signed)
Chronic, historic condition The 10-year ASCVD risk score (Arnett DK, et al., 2019) is: 3.4% He is taking Atorvastatin 20 mg pO every day and reports he is tolerating well Recheck labs in 3 months Continue current regimen for now  Follow up in 3 months or sooner if concerns arise

## 2022-11-21 ENCOUNTER — Other Ambulatory Visit: Payer: Self-pay | Admitting: Podiatry

## 2022-11-21 ENCOUNTER — Other Ambulatory Visit: Payer: Self-pay | Admitting: Nurse Practitioner

## 2022-11-22 NOTE — Telephone Encounter (Signed)
Requested Prescriptions  Pending Prescriptions Disp Refills   Semaglutide, 2 MG/DOSE, (OZEMPIC, 2 MG/DOSE,) 8 MG/3ML SOPN [Pharmacy Med Name: OZEMPIC 8 MG/3 ML (2 MG/DOSE)] 6 mL 0    Sig: INJECT 2 MG AS DIRECTED ONCE A WEEK.     Endocrinology:  Diabetes - GLP-1 Receptor Agonists - semaglutide Failed - 11/21/2022  5:01 PM      Failed - HBA1C in normal range and within 180 days    HB A1C (BAYER DCA - WAIVED)  Date Value Ref Range Status  10/24/2022 7.9 (H) 4.8 - 5.6 % Final    Comment:             Prediabetes: 5.7 - 6.4          Diabetes: >6.4          Glycemic control for adults with diabetes: <7.0          Passed - Cr in normal range and within 360 days    Creatinine, Ser  Date Value Ref Range Status  07/18/2022 1.04 0.76 - 1.27 mg/dL Final         Passed - Valid encounter within last 6 months    Recent Outpatient Visits           4 weeks ago Type 2 diabetes mellitus without obesity (HCC)   [place] [facility] Mecum, Oswaldo Conroy, PA-C   4 months ago Annual physical exam   [place] [facility] Larae Grooms, NP   7 months ago Hyperlipidemia associated with type 2 diabetes mellitus [facility])   [place] [facility] Larae Grooms, NP   11 months ago Hyperlipidemia associated with type 2 diabetes mellitus [facility])   [place] [facility] Larae Grooms, NP   1 year ago Hyperlipidemia associated with type 2 diabetes mellitus [facility])   [place] [facility] Larae Grooms, NP       Future Appointments             In 2 months Larae Grooms, NP [facility]

## 2022-11-23 ENCOUNTER — Other Ambulatory Visit: Payer: Self-pay | Admitting: Nurse Practitioner

## 2022-12-17 ENCOUNTER — Other Ambulatory Visit: Payer: Self-pay | Admitting: Nurse Practitioner

## 2022-12-19 NOTE — Telephone Encounter (Signed)
Requested Prescriptions  Pending Prescriptions Disp Refills   atorvastatin (LIPITOR) 20 MG tablet [Pharmacy Med Name: ATORVASTATIN 20 MG TABLET] 90 tablet 0    Sig: TAKE 1 TABLET BY MOUTH EVERY DAY     Cardiovascular:  Antilipid - Statins Failed - 12/17/2022  2:49 PM      Failed - Lipid Panel in normal range within the last 12 months    Cholesterol, Total  Date Value Ref Range Status  07/18/2022 166 100 - 199 mg/dL Final   Cholesterol Piccolo, Waived  Date Value Ref Range Status  08/07/2019 157 <200 mg/dL Final    Comment:                            Desirable                <200                         Borderline High      200- 239                         High                     >239    LDL Chol Calc (NIH)  Date Value Ref Range Status  07/18/2022 104 (H) 0 - 99 mg/dL Final   HDL  Date Value Ref Range Status  07/18/2022 46 >39 mg/dL Final   Triglycerides  Date Value Ref Range Status  07/18/2022 83 0 - 149 mg/dL Final   Triglycerides Piccolo,Waived  Date Value Ref Range Status  08/07/2019 106 <150 mg/dL Final    Comment:                            Normal                   <150                         Borderline High     150 - 199                         High                200 - 499                         Very High                >499          Passed - Patient is not pregnant      Passed - Valid encounter within last 12 months    Recent Outpatient Visits           1 month ago Type 2 diabetes mellitus without obesity (HCC)   [place] [facility] Mecum, Oswaldo Conroy, PA-C   5 months ago Annual physical exam   [place] [facility] Larae Grooms, NP   8 months ago Hyperlipidemia associated with type 2 diabetes mellitus [facility])   [facility] Larae Grooms, NP   12 months ago Hyperlipidemia associated with type 2 diabetes mellitus [facility])   [facility] Larae Grooms, NP  1 year ago  Hyperlipidemia associated with type 2 diabetes mellitus [facility])   [place] [facility] Larae Grooms, NP       Future Appointments             In 1 month Larae Grooms, NP [facility]

## 2023-01-23 NOTE — Progress Notes (Unsigned)
There were no vitals taken for this visit.   Subjective:    Patient ID: Aaron Wood, male    DOB: 1975-06-26, 47 y.o.   MRN: [id number]  HPI: Aaron Wood is a 47 y.o. male  No chief complaint on file.  HYPERLIPIDEMIA Hyperlipidemia status: excellent compliance Satisfied with current treatment?  no Side effects:  yes Medication compliance: excellent compliance Past cholesterol meds: atorvastain (lipitor) Supplements: none Aspirin:  no The 10-year ASCVD risk score (Arnett DK, et al., 2019) is: 3.4%   Values used to calculate the score:     Age: 37 years     Sex: Male     Is Non-Hispanic African American: No     Diabetic: Yes     Tobacco smoker: No     Systolic Blood Pressure: 109 mmHg     Is BP treated: No     HDL Cholesterol: 46 mg/dL     Total Cholesterol: 166 mg/dL Chest pain:  no Coronary artery disease:  no Family history CAD:  no Family history early CAD:  no  DIABETES Was out of Ozempic for a month due to shortages.  Has been back on it recently.  Hypoglycemic episodes:no Polydipsia/polyuria: no Visual disturbance: no Chest pain: no Paresthesias: no Glucose Monitoring: no  Accucheck frequency: Not Checking  Fasting glucose:  Post prandial:  Evening:  Before meals: Taking Insulin?: no  Long acting insulin:  Short acting insulin: Blood Pressure Monitoring: not checking Retinal Examination: Not up to Date Foot Exam: Up to Date Diabetic Education: Not Completed Pneumovax: Not up to Date Influenza: Declines Aspirin: no   Denies HA, CP, SOB, dizziness, palpitations, visual changes, and lower extremity swelling.  Relevant past medical, surgical, family and social history reviewed and updated as indicated. Interim medical history since our last visit reviewed. Allergies and medications reviewed and updated.  Review of Systems  Eyes:  Negative for visual disturbance.  Respiratory:  Negative for chest tightness and shortness of breath.    Cardiovascular:  Negative for chest pain, palpitations and leg swelling.  Endocrine: Negative for polydipsia and polyuria.  Neurological:  Negative for dizziness, light-headedness, numbness and headaches.    Per HPI unless specifically indicated above     Objective:    There were no vitals taken for this visit.  Wt Readings from Last 3 Encounters:  10/24/22 161 lb 6.4 oz (73.2 kg)  07/18/22 163 lb 9.6 oz (74.2 kg)  04/18/22 169 lb 11.2 oz (77 kg)    Physical Exam Vitals and nursing note reviewed.  Constitutional:      General: He is not in acute distress.    Appearance: Normal appearance. He is normal weight. He is not ill-appearing, toxic-appearing or diaphoretic.  HENT:     Head: Normocephalic.     Right Ear: External ear normal.     Left Ear: External ear normal.     Nose: Nose normal. No congestion or rhinorrhea.     Mouth/Throat:     Mouth: Mucous membranes are moist.  Eyes:     General:        Right eye: No discharge.        Left eye: No discharge.     Extraocular Movements: Extraocular movements intact.     Conjunctiva/sclera: Conjunctivae normal.     Pupils: Pupils are equal, round, and reactive to light.  Cardiovascular:     Rate and Rhythm: Normal rate and regular rhythm.     Heart sounds: No murmur heard.  Pulmonary:     Effort: Pulmonary effort is normal. No respiratory distress.     Breath sounds: Normal breath sounds. No wheezing, rhonchi or rales.  Abdominal:     General: Abdomen is flat. Bowel sounds are normal.  Musculoskeletal:     Cervical back: Normal range of motion and neck supple.  Skin:    General: Skin is warm and dry.     Capillary Refill: Capillary refill takes less than 2 seconds.  Neurological:     General: No focal deficit present.     Mental Status: He is alert and oriented to person, place, and time.  Psychiatric:        Mood and Affect: Mood normal.        Behavior: Behavior normal.        Thought Content: Thought content normal.         Judgment: Judgment normal.     Results for orders placed or performed in visit on 10/24/22  Bayer DCA Hb A1c Waived (STAT)  Result Value Ref Range   HB A1C (BAYER DCA - WAIVED) 7.9 (H) 4.8 - 5.6 %      Assessment & Plan:   Problem List Items Addressed This Visit       Respiratory   OSA (obstructive sleep apnea)     Endocrine   Type 2 diabetes mellitus without obesity (HCC) - Primary   Hyperlipidemia associated with type 2 diabetes mellitus (HCC)     Follow up plan: No follow-ups on file.

## 2023-01-24 ENCOUNTER — Encounter: Payer: Self-pay | Admitting: Nurse Practitioner

## 2023-01-24 ENCOUNTER — Ambulatory Visit: Payer: BC Managed Care – PPO | Admitting: Nurse Practitioner

## 2023-01-24 VITALS — BP 113/75 | HR 94 | Temp 98.6°F | Wt 156.6 lb

## 2023-01-24 DIAGNOSIS — E119 Type 2 diabetes mellitus without complications: Secondary | ICD-10-CM

## 2023-01-24 DIAGNOSIS — E785 Hyperlipidemia, unspecified: Secondary | ICD-10-CM | POA: Diagnosis not present

## 2023-01-24 DIAGNOSIS — G4733 Obstructive sleep apnea (adult) (pediatric): Secondary | ICD-10-CM

## 2023-01-24 DIAGNOSIS — Z23 Encounter for immunization: Secondary | ICD-10-CM

## 2023-01-24 DIAGNOSIS — E1169 Type 2 diabetes mellitus with other specified complication: Secondary | ICD-10-CM | POA: Diagnosis not present

## 2023-01-24 DIAGNOSIS — Z7985 Long-term (current) use of injectable non-insulin antidiabetic drugs: Secondary | ICD-10-CM

## 2023-01-24 MED ORDER — SILDENAFIL CITRATE 50 MG PO TABS: 50.0000 mg | ORAL_TABLET | ORAL | 3 refills | Status: DC | PRN

## 2023-01-24 MED ORDER — OZEMPIC (2 MG/DOSE) 8 MG/3ML ~~LOC~~ SOPN: 2.0000 mg | PEN_INJECTOR | SUBCUTANEOUS | 3 refills | Status: DC

## 2023-01-24 MED ORDER — DAPAGLIFLOZIN PROPANEDIOL 10 MG PO TABS: 10.0000 mg | ORAL_TABLET | Freq: Every day | ORAL | 1 refills | Status: DC

## 2023-01-24 MED ORDER — METFORMIN HCL 1000 MG PO TABS: ORAL_TABLET | ORAL | 1 refills | Status: DC

## 2023-01-24 MED ORDER — ATORVASTATIN CALCIUM 20 MG PO TABS: 20.0000 mg | ORAL_TABLET | Freq: Every day | ORAL | 1 refills | Status: DC

## 2023-01-24 NOTE — Assessment & Plan Note (Signed)
Chronic.  Controlled.  Continue with current medication regimen of Atorvastatin daily.  Labs ordered today.  Return to clinic in 3 months for reevaluation.  Call sooner if concerns arise.   

## 2023-01-24 NOTE — Assessment & Plan Note (Signed)
Chronic.  Controlled.  Last A1c 7.9% in July.  Continue with current medication regimen of metformin 1000mg  BID, Ozempic 2mg  weekly, Farxiga 10mg  daily.   Doing well with current regimen.  Refills sent today.  Labs ordered today.  Return to clinic in 3 months for reevaluation.  Call sooner if concerns arise.

## 2023-01-25 LAB — LIPID PANEL
Chol/HDL Ratio: 3 {ratio} (ref 0.0–5.0)
Cholesterol, Total: 136 mg/dL (ref 100–199)
HDL: 46 mg/dL (ref >39)
LDL Chol Calc (NIH): 79 mg/dL (ref 0–99)
Triglycerides: 47 mg/dL (ref 0–149)
VLDL Cholesterol Cal: 11 mg/dL (ref 5–40)

## 2023-01-25 LAB — COMPREHENSIVE METABOLIC PANEL
ALT: 11 [IU]/L (ref 0–44)
AST: 13 [IU]/L (ref 0–40)
Albumin: 4.6 g/dL (ref 4.1–5.1)
Alkaline Phosphatase: 69 [IU]/L (ref 44–121)
BUN/Creatinine Ratio: 15 (ref 9–20)
BUN: 17 mg/dL (ref 6–24)
Bilirubin Total: 0.6 mg/dL (ref 0.0–1.2)
CO2: 22 mmol/L (ref 20–29)
Calcium: 9.8 mg/dL (ref 8.7–10.2)
Chloride: 103 mmol/L (ref 96–106)
Creatinine, Ser: 1.1 mg/dL (ref 0.76–1.27)
Globulin, Total: 2.5 g/dL (ref 1.5–4.5)
Glucose: 113 mg/dL — ABNORMAL HIGH (ref 70–99)
Potassium: 4.9 mmol/L (ref 3.5–5.2)
Sodium: 141 mmol/L (ref 134–144)
Total Protein: 7.1 g/dL (ref 6.0–8.5)
eGFR: 83 mL/min/{1.73_m2} (ref >59)

## 2023-01-25 LAB — HEMOGLOBIN A1C
Est. average glucose Bld gHb Est-mCnc: 174 mg/dL
Hgb A1c MFr Bld: 7.7 % — ABNORMAL HIGH (ref 4.8–5.6)

## 2023-01-25 NOTE — Progress Notes (Signed)
Hi Aaron Wood. It was nice to see you yesterday.  Your lab work looks good.  Your A1c has improved to 7.7% which is great news.  Keep up the good work.  No concerns at this time. Continue with your current medication regimen.  Follow up as discussed.  Please let me know if you have any questions.

## 2023-03-01 ENCOUNTER — Ambulatory Visit: Payer: BC Managed Care – PPO | Admitting: Podiatry

## 2023-04-25 ENCOUNTER — Ambulatory Visit: Payer: BC Managed Care – PPO | Admitting: Nurse Practitioner

## 2023-04-25 ENCOUNTER — Encounter: Payer: Self-pay | Admitting: Nurse Practitioner

## 2023-04-25 VITALS — BP 115/78 | HR 88 | Temp 98.4°F | Ht 67.5 in | Wt 161.8 lb

## 2023-04-25 DIAGNOSIS — Z7985 Long-term (current) use of injectable non-insulin antidiabetic drugs: Secondary | ICD-10-CM

## 2023-04-25 DIAGNOSIS — E119 Type 2 diabetes mellitus without complications: Secondary | ICD-10-CM

## 2023-04-25 DIAGNOSIS — E1169 Type 2 diabetes mellitus with other specified complication: Secondary | ICD-10-CM

## 2023-04-25 DIAGNOSIS — E785 Hyperlipidemia, unspecified: Secondary | ICD-10-CM

## 2023-04-25 LAB — MICROALBUMIN, URINE WAIVED
Creatinine, Urine Waived: 200 mg/dL (ref 10–300)
Microalb, Ur Waived: 30 mg/L — ABNORMAL HIGH (ref 0–19)
Microalb/Creat Ratio: <30 mg/g (ref <30)

## 2023-04-25 NOTE — Assessment & Plan Note (Signed)
 Chronic.  Controlled.  Last A1c 7.7% in July.  Continue with current medication regimen of metformin  1000mg  BID, Ozempic  2mg  weekly, Farxiga  10mg  daily.   Doing well with current regimen.  Microalbumin checked today.  Refills sent today.  Labs ordered today.  Return to clinic in 6 months for reevaluation.  Call sooner if concerns arise.

## 2023-04-25 NOTE — Progress Notes (Signed)
 BP 115/78 (BP Location: Right Arm, Patient Position: Sitting, Cuff Size: Normal)   Pulse 88   Temp 98.4 F (36.9 C) (Oral)   Ht 5' 7.5 (1.715 m)   Wt 161 lb 12.8 oz (73.4 kg)   SpO2 97%   BMI 24.97 kg/m    Subjective:    Patient ID: Aaron Wood, male    DOB: 1975-07-20, 48 y.o.   MRN: [id number]  HPI: Aaron Wood is a 48 y.o. male  Chief Complaint  Patient presents with   Diabetes   HYPERLIPIDEMIA Doing well with atorvastatin . No concerns.  Hyperlipidemia status: excellent compliance Satisfied with current treatment?  no Side effects:  yes Medication compliance: excellent compliance Past cholesterol meds: atorvastain (lipitor) Supplements: none Aspirin:  no The 10-year ASCVD risk score (Arnett DK, et al., 2019) is: 2.7%   Values used to calculate the score:     Age: 22 years     Sex: Male     Is Non-Hispanic African American: No     Diabetic: Yes     Tobacco smoker: No     Systolic Blood Pressure: 115 mmHg     Is BP treated: No     HDL Cholesterol: 46 mg/dL     Total Cholesterol: 136 mg/dL Chest pain:  no Coronary artery disease:  no Family history CAD:  no Family history early CAD:  no  DIABETES Patient is on Ozempic , Metformin  and Farxiga .   Last A1c was 7.7 in October. Has no concerns regarding medications.  Hypoglycemic episodes:no Polydipsia/polyuria: no Visual disturbance: no Chest pain: no Paresthesias: no Glucose Monitoring: no  Accucheck frequency: Not Checking  Fasting glucose:  Post prandial:  Evening:  Before meals: Taking Insulin?: no  Long acting insulin:  Short acting insulin: Blood Pressure Monitoring: not checking Retinal Examination: Not up to Date- plans to make an appt Foot Exam: Up to Date Diabetic Education: Not Completed Pneumovax: Not up to Date Influenza: Declines Aspirin: no   Denies HA, CP, SOB, dizziness, palpitations, visual changes, and lower extremity swelling.  Relevant past medical, surgical, family  and social history reviewed and updated as indicated. Interim medical history since our last visit reviewed. Allergies and medications reviewed and updated.  Review of Systems  Eyes:  Negative for visual disturbance.  Respiratory:  Negative for chest tightness and shortness of breath.   Cardiovascular:  Negative for chest pain, palpitations and leg swelling.  Endocrine: Negative for polydipsia and polyuria.  Neurological:  Negative for dizziness, light-headedness, numbness and headaches.    Per HPI unless specifically indicated above     Objective:    BP 115/78 (BP Location: Right Arm, Patient Position: Sitting, Cuff Size: Normal)   Pulse 88   Temp 98.4 F (36.9 C) (Oral)   Ht 5' 7.5 (1.715 m)   Wt 161 lb 12.8 oz (73.4 kg)   SpO2 97%   BMI 24.97 kg/m   Wt Readings from Last 3 Encounters:  04/25/23 161 lb 12.8 oz (73.4 kg)  01/24/23 156 lb 9.6 oz (71 kg)  10/24/22 161 lb 6.4 oz (73.2 kg)    Physical Exam Vitals and nursing note reviewed.  Constitutional:      General: He is not in acute distress.    Appearance: Normal appearance. He is normal weight. He is not ill-appearing, toxic-appearing or diaphoretic.  HENT:     Head: Normocephalic.     Right Ear: External ear normal.     Left Ear: External ear normal.  Nose: Nose normal. No congestion or rhinorrhea.     Mouth/Throat:     Mouth: Mucous membranes are moist.  Eyes:     General:        Right eye: No discharge.        Left eye: No discharge.     Extraocular Movements: Extraocular movements intact.     Conjunctiva/sclera: Conjunctivae normal.     Pupils: Pupils are equal, round, and reactive to light.  Cardiovascular:     Rate and Rhythm: Normal rate and regular rhythm.     Heart sounds: No murmur heard. Pulmonary:     Effort: Pulmonary effort is normal. No respiratory distress.     Breath sounds: Normal breath sounds. No wheezing, rhonchi or rales.  Abdominal:     General: Abdomen is flat. Bowel sounds are  normal.  Musculoskeletal:     Cervical back: Normal range of motion and neck supple.  Skin:    General: Skin is warm and dry.     Capillary Refill: Capillary refill takes less than 2 seconds.  Neurological:     General: No focal deficit present.     Mental Status: He is alert and oriented to person, place, and time.  Psychiatric:        Mood and Affect: Mood normal.        Behavior: Behavior normal.        Thought Content: Thought content normal.        Judgment: Judgment normal.     Results for orders placed or performed in visit on 01/24/23  Comp Met (CMET)   Collection Time: 01/24/23 10:16 AM  Result Value Ref Range   Glucose 113 (H) 70 - 99 mg/dL   BUN 17 6 - 24 mg/dL   Creatinine, Ser 8.89 0.76 - 1.27 mg/dL   eGFR 83 >40 fO/fpw/8.26   BUN/Creatinine Ratio 15 9 - 20   Sodium 141 134 - 144 mmol/L   Potassium 4.9 3.5 - 5.2 mmol/L   Chloride 103 96 - 106 mmol/L   CO2 22 20 - 29 mmol/L   Calcium  9.8 8.7 - 10.2 mg/dL   Total Protein 7.1 6.0 - 8.5 g/dL   Albumin 4.6 4.1 - 5.1 g/dL   Globulin, Total 2.5 1.5 - 4.5 g/dL   Bilirubin Total 0.6 0.0 - 1.2 mg/dL   Alkaline Phosphatase 69 44 - 121 IU/L   AST 13 0 - 40 IU/L   ALT 11 0 - 44 IU/L  HgB A1c   Collection Time: 01/24/23 10:16 AM  Result Value Ref Range   Hgb A1c MFr Bld 7.7 (H) 4.8 - 5.6 %   Est. average glucose Bld gHb Est-mCnc 174 mg/dL  Lipid Profile   Collection Time: 01/24/23 10:16 AM  Result Value Ref Range   Cholesterol, Total 136 100 - 199 mg/dL   Triglycerides 47 0 - 149 mg/dL   HDL 46 >60 mg/dL   VLDL Cholesterol Cal 11 5 - 40 mg/dL   LDL Chol Calc (NIH) 79 0 - 99 mg/dL   Chol/HDL Ratio 3.0 0.0 - 5.0 ratio      Assessment & Plan:   Problem List Items Addressed This Visit       Endocrine   Type 2 diabetes mellitus without obesity (HCC) - Primary   Chronic.  Controlled.  Last A1c 7.7% in July.  Continue with current medication regimen of metformin  1000mg  BID, Ozempic  2mg  weekly, Farxiga  10mg  daily.    Doing well with current regimen.  Microalbumin checked today.  Refills sent today.  Labs ordered today.  Return to clinic in 6 months for reevaluation.  Call sooner if concerns arise.       Relevant Orders   Comp Met (CMET)   HgB A1c   Microalbumin, Urine Waived   Hyperlipidemia associated with type 2 diabetes mellitus (HCC)   Chronic.  Controlled.  Continue with current medication regimen of Atorvastatin  daily.   Labs ordered today.  Return to clinic in 6 months for reevaluation.  Call sooner if concerns arise.         Follow up plan: Return in about 6 months (around 10/23/2023) for HTN, HLD, DM2 FU.

## 2023-04-25 NOTE — Assessment & Plan Note (Signed)
 Chronic.  Controlled.  Continue with current medication regimen of Atorvastatin daily.  Labs ordered today.  Return to clinic in 6 months for reevaluation.  Call sooner if concerns arise.

## 2023-04-26 LAB — COMPREHENSIVE METABOLIC PANEL
ALT: 11 [IU]/L (ref 0–44)
AST: 16 [IU]/L (ref 0–40)
Albumin: 4.5 g/dL (ref 4.1–5.1)
Alkaline Phosphatase: 67 [IU]/L (ref 44–121)
BUN/Creatinine Ratio: 13 (ref 9–20)
BUN: 13 mg/dL (ref 6–24)
Bilirubin Total: 0.3 mg/dL (ref 0.0–1.2)
CO2: 21 mmol/L (ref 20–29)
Calcium: 9.6 mg/dL (ref 8.7–10.2)
Chloride: 104 mmol/L (ref 96–106)
Creatinine, Ser: 1 mg/dL (ref 0.76–1.27)
Globulin, Total: 2.3 g/dL (ref 1.5–4.5)
Glucose: 131 mg/dL — ABNORMAL HIGH (ref 70–99)
Potassium: 4.6 mmol/L (ref 3.5–5.2)
Sodium: 139 mmol/L (ref 134–144)
Total Protein: 6.8 g/dL (ref 6.0–8.5)
eGFR: 93 mL/min/{1.73_m2} (ref >59)

## 2023-04-26 LAB — HEMOGLOBIN A1C
Est. average glucose Bld gHb Est-mCnc: 174 mg/dL
Hgb A1c MFr Bld: 7.7 % — ABNORMAL HIGH (ref 4.8–5.6)

## 2023-06-06 LAB — HM DIABETES EYE EXAM

## 2023-08-29 ENCOUNTER — Other Ambulatory Visit: Payer: Self-pay | Admitting: Nurse Practitioner

## 2023-08-30 NOTE — Telephone Encounter (Signed)
 Requested Prescriptions  Pending Prescriptions Disp Refills   atorvastatin  (LIPITOR) 20 MG tablet [Pharmacy Med Name: ATORVASTATIN  20 MG TABLET] 90 tablet 1    Sig: TAKE 1 TABLET BY MOUTH EVERY DAY     Cardiovascular:  Antilipid - Statins Failed - 08/30/2023 11:49 AM      Failed - Valid encounter within last 12 months    Recent Outpatient Visits   None     Future Appointments             In 1 month Aileen Alexanders, NP [facility]            Failed - Lipid Panel in normal range within the last 12 months    Cholesterol, Total  Date Value Ref Range Status  01/24/2023 136 100 - 199 mg/dL Final   Cholesterol Piccolo, Waived  Date Value Ref Range Status  08/07/2019 157 <200 mg/dL Final    Comment:                            Desirable                <200                         Borderline High      200- 239                         High                     >239    LDL Chol Calc (NIH)  Date Value Ref Range Status  01/24/2023 79 0 - 99 mg/dL Final   HDL  Date Value Ref Range Status  01/24/2023 46 >39 mg/dL Final   Triglycerides  Date Value Ref Range Status  01/24/2023 47 0 - 149 mg/dL Final   Triglycerides Piccolo,Waived  Date Value Ref Range Status  08/07/2019 106 <150 mg/dL Final    Comment:                            Normal                   <150                         Borderline High     150 - 199                         High                200 - 499                         Very High                >499          Passed - Patient is not pregnant

## 2023-09-11 ENCOUNTER — Other Ambulatory Visit: Payer: Self-pay | Admitting: Nurse Practitioner

## 2023-09-13 NOTE — Telephone Encounter (Signed)
 Requested medications are due for refill today.  yes  Requested medications are on the active medications list.  yes  Last refill. 01/24/2023 #180 1 rf  Future visit scheduled.   yes  Notes to clinic.  Labs are expired.    Requested Prescriptions  Pending Prescriptions Disp Refills   metFORMIN  (GLUCOPHAGE ) 1000 MG tablet [Pharmacy Med Name: METFORMIN  HCL 1,000 MG TABLET] 180 tablet 1    Sig: TAKE 1 TABLET BY MOUTH TWICE A DAY WITH FOOD     Endocrinology:  Diabetes - Biguanides Failed - 09/13/2023 11:00 AM      Failed - B12 Level in normal range and within 720 days    Vitamin B-12  Date Value Ref Range Status  03/01/2020 445 232 - 1,245 pg/mL Final         Failed - Valid encounter within last 6 months    Recent Outpatient Visits   None     Future Appointments             In 1 month Aileen Alexanders, NP [place]            Failed - CBC within normal limits and completed in the last 12 months    WBC  Date Value Ref Range Status  07/18/2022 6.7 3.4 - 10.8 x10E3/uL Final  04/04/2020 8.8 4.0 - 10.5 K/uL Final   RBC  Date Value Ref Range Status  07/18/2022 5.57 4.14 - 5.80 x10E6/uL Final  04/04/2020 5.29 4.22 - 5.81 MIL/uL Final   Hemoglobin  Date Value Ref Range Status  07/18/2022 16.1 13.0 - 17.7 g/dL Final   Total hemoglobin  Date Value Ref Range Status  04/04/2020 16.3 (H) 12.0 - 16.0 g/dL Final    Comment:    CRITICAL RESULT CALLED TO, READ BACK BY AND VERIFIED WITH: Ellerbe, RN AT 1915 ON [id number] [place]    Hematocrit  Date Value Ref Range Status  07/18/2022 48.1 37.5 - 51.0 % Final   MCHC  Date Value Ref Range Status  07/18/2022 33.5 31.5 - 35.7 g/dL Final  54/12/8117 14.7 30.0 - 36.0 g/dL Final   [facility]  Date Value Ref Range Status  07/18/2022 28.9 26.6 - 33.0 pg Final  04/04/2020 29.5 26.0 - 34.0 pg Final   MCV  Date Value Ref Range Status  07/18/2022 86 79 - 97 fL Final   No results found for:  "PLTCOUNTKUC", "LABPLAT", "POCPLA" RDW  Date Value Ref Range Status  07/18/2022 13.2 11.6 - 15.4 % Final         Passed - Cr in normal range and within 360 days    Creatinine, Ser  Date Value Ref Range Status  04/25/2023 1.00 0.76 - 1.27 mg/dL Final         Passed - HBA1C is between 0 and 7.9 and within 180 days    HB A1C (BAYER DCA - WAIVED)  Date Value Ref Range Status  10/24/2022 7.9 (H) 4.8 - 5.6 % Final    Comment:             Prediabetes: 5.7 - 6.4          Diabetes: >6.4          Glycemic control for adults with diabetes: <7.0    Hgb A1c MFr Bld  Date Value Ref Range Status  04/25/2023 7.7 (H) 4.8 - 5.6 % Final    Comment:  Prediabetes: 5.7 - 6.4          Diabetes: >6.4          Glycemic control for adults with diabetes: <7.0          Passed - eGFR in normal range and within 360 days    GFR calc Af Amer  Date Value Ref Range Status  03/01/2020 106 >59 mL/min/1.73 Final    Comment:    **In accordance with recommendations from the NKF-ASN Task force,**   [facility] is in the process of updating its eGFR calculation to the   2021 CKD-EPI creatinine equation that estimates kidney function   without a race variable.    GFR, Estimated  Date Value Ref Range Status  04/04/2020 >60 >60 mL/min Final    Comment:    (NOTE) Calculated using the CKD-EPI Creatinine Equation (2021)    eGFR  Date Value Ref Range Status  04/25/2023 93 >59 mL/min/1.73 Final

## 2023-09-22 ENCOUNTER — Other Ambulatory Visit: Payer: Self-pay | Admitting: Nurse Practitioner

## 2023-09-24 NOTE — Telephone Encounter (Signed)
 Requested medication (s) are due for refill today:   Yes  Requested medication (s) are on the active medication list:   Yes  Future visit scheduled:   Yes 7/14 with Mariah Shines     LOV 04/25/2023   Last ordered: 01/24/2023 #90, 1 refill  Unable to refill because OV due.   Provider to review for refills prior to upcoming appt in July     Requested Prescriptions  Pending Prescriptions Disp Refills   FARXIGA  10 MG TABS tablet [Pharmacy Med Name: FARXIGA  10 MG TABLET] 90 tablet 1    Sig: TAKE 1 TABLET BY MOUTH EVERY DAY     Endocrinology:  Diabetes - SGLT2 Inhibitors Failed - 09/24/2023  9:47 AM      Failed - Valid encounter within last 6 months    Recent Outpatient Visits   None     Future Appointments             In 1 month Aileen Alexanders, NP [facility]            Passed - Cr in normal range and within 360 days    Creatinine, Ser  Date Value Ref Range Status  04/25/2023 1.00 0.76 - 1.27 mg/dL Final         Passed - HBA1C is between 0 and 7.9 and within 180 days    HB A1C (BAYER DCA - WAIVED)  Date Value Ref Range Status  10/24/2022 7.9 (H) 4.8 - 5.6 % Final    Comment:             Prediabetes: 5.7 - 6.4          Diabetes: >6.4          Glycemic control for adults with diabetes: <7.0    Hgb A1c MFr Bld  Date Value Ref Range Status  04/25/2023 7.7 (H) 4.8 - 5.6 % Final    Comment:             Prediabetes: 5.7 - 6.4          Diabetes: >6.4          Glycemic control for adults with diabetes: <7.0          Passed - eGFR in normal range and within 360 days    GFR calc Af Amer  Date Value Ref Range Status  03/01/2020 106 >59 mL/min/1.73 Final    Comment:    **In accordance with recommendations from the NKF-ASN Task force,**   [facility] is in the process of updating its eGFR calculation to the   2021 CKD-EPI creatinine equation that estimates kidney function   without a race variable.    GFR, Estimated  Date Value Ref Range Status   04/04/2020 >60 >60 mL/min Final    Comment:    (NOTE) Calculated using the CKD-EPI Creatinine Equation (2021)    eGFR  Date Value Ref Range Status  04/25/2023 93 >59 mL/min/1.73 Final

## 2023-10-28 ENCOUNTER — Ambulatory Visit: Payer: Self-pay | Admitting: Nurse Practitioner

## 2023-10-28 ENCOUNTER — Encounter: Payer: Self-pay | Admitting: Nurse Practitioner

## 2023-10-28 VITALS — BP 118/75 | HR 81 | Temp 97.7°F | Wt 158.8 lb

## 2023-10-28 DIAGNOSIS — E1169 Type 2 diabetes mellitus with other specified complication: Secondary | ICD-10-CM | POA: Diagnosis not present

## 2023-10-28 DIAGNOSIS — E119 Type 2 diabetes mellitus without complications: Secondary | ICD-10-CM | POA: Diagnosis not present

## 2023-10-28 DIAGNOSIS — E785 Hyperlipidemia, unspecified: Secondary | ICD-10-CM

## 2023-10-28 MED ORDER — OZEMPIC (2 MG/DOSE) 8 MG/3ML ~~LOC~~ SOPN: 2.0000 mg | PEN_INJECTOR | SUBCUTANEOUS | 3 refills | Status: DC

## 2023-10-28 NOTE — Progress Notes (Signed)
 BP 118/75   Pulse 81   Temp 97.7 F (36.5 C) (Oral)   Wt 158 lb 12.8 oz (72 kg)   SpO2 98%   BMI 24.50 kg/m    Subjective:    Patient ID: Aaron Wood, male    DOB: 1975/07/06, 48 y.o.   MRN: [id number]  HPI: Aaron Wood is a 48 y.o. male  Chief Complaint  Patient presents with   Diabetes    2 Months ago [facility] will request    HYPERLIPIDEMIA Doing well with atorvastatin . No concerns.  Hyperlipidemia status: excellent compliance Satisfied with current treatment?  no Side effects:  yes Medication compliance: excellent compliance Past cholesterol meds: atorvastain (lipitor) Supplements: none Aspirin:  no The 10-year ASCVD risk score (Arnett DK, et al., 2019) is: 3.2%   Values used to calculate the score:     Age: 66 years     Clincally relevant sex: Male     Is Non-Hispanic African American: No     Diabetic: Yes     Tobacco smoker: No     Systolic Blood Pressure: 118 mmHg     Is BP treated: No     HDL Cholesterol: 46 mg/dL     Total Cholesterol: 136 mg/dL Chest pain:  no Coronary artery disease:  no Family history CAD:  no Family history early CAD:  no  DIABETES Patient is on Ozempic , Metformin  and Farxiga .   Last A1c was 7.7 in October. Has no concerns regarding medications.  Hypoglycemic episodes:no Polydipsia/polyuria: no Visual disturbance: no Chest pain: no Paresthesias: no Glucose Monitoring: no  Accucheck frequency: Not Checking  Fasting glucose:  Post prandial:  Evening:  Before meals: Taking Insulin?: no  Long acting insulin:  Short acting insulin: Blood Pressure Monitoring: not checking Retinal Examination: Not up to Date- will request. Had it done 2 months ago.  Foot Exam: Up to Date Diabetic Education: Not Completed Pneumovax: Not up to Date Influenza: Declines Aspirin: no   Denies HA, CP, SOB, dizziness, palpitations, visual changes, and lower extremity swelling.  Relevant past medical, surgical, family and social  history reviewed and updated as indicated. Interim medical history since our last visit reviewed. Allergies and medications reviewed and updated.  Review of Systems  Eyes:  Negative for visual disturbance.  Respiratory:  Negative for chest tightness and shortness of breath.   Cardiovascular:  Negative for chest pain, palpitations and leg swelling.  Endocrine: Negative for polydipsia and polyuria.  Neurological:  Negative for dizziness, light-headedness, numbness and headaches.    Per HPI unless specifically indicated above     Objective:    BP 118/75   Pulse 81   Temp 97.7 F (36.5 C) (Oral)   Wt 158 lb 12.8 oz (72 kg)   SpO2 98%   BMI 24.50 kg/m   Wt Readings from Last 3 Encounters:  10/28/23 158 lb 12.8 oz (72 kg)  04/25/23 161 lb 12.8 oz (73.4 kg)  01/24/23 156 lb 9.6 oz (71 kg)    Physical Exam Vitals and nursing note reviewed.  Constitutional:      General: He is not in acute distress.    Appearance: Normal appearance. He is normal weight. He is not ill-appearing, toxic-appearing or diaphoretic.  HENT:     Head: Normocephalic.     Right Ear: External ear normal.     Left Ear: External ear normal.     Nose: Nose normal. No congestion or rhinorrhea.     Mouth/Throat:  Mouth: Mucous membranes are moist.  Eyes:     General:        Right eye: No discharge.        Left eye: No discharge.     Extraocular Movements: Extraocular movements intact.     Conjunctiva/sclera: Conjunctivae normal.     Pupils: Pupils are equal, round, and reactive to light.  Cardiovascular:     Rate and Rhythm: Normal rate and regular rhythm.     Heart sounds: No murmur heard. Pulmonary:     Effort: Pulmonary effort is normal. No respiratory distress.     Breath sounds: Normal breath sounds. No wheezing, rhonchi or rales.  Abdominal:     General: Abdomen is flat. Bowel sounds are normal.  Musculoskeletal:     Cervical back: Normal range of motion and neck supple.  Skin:    General:  Skin is warm and dry.     Capillary Refill: Capillary refill takes less than 2 seconds.  Neurological:     General: No focal deficit present.     Mental Status: He is alert and oriented to person, place, and time.  Psychiatric:        Mood and Affect: Mood normal.        Behavior: Behavior normal.        Thought Content: Thought content normal.        Judgment: Judgment normal.     Results for orders placed or performed in visit on 04/25/23  Microalbumin, Urine Waived   Collection Time: 04/25/23  8:37 AM  Result Value Ref Range   Microalb, Ur Waived 30 (H) 0 - 19 mg/L   Creatinine, Urine Waived 200 10 - 300 mg/dL   Microalb/Creat Ratio <30 <30 mg/g  Comp Met (CMET)   Collection Time: 04/25/23  8:38 AM  Result Value Ref Range   Glucose 131 (H) 70 - 99 mg/dL   BUN 13 6 - 24 mg/dL   Creatinine, Ser 8.99 0.76 - 1.27 mg/dL   eGFR 93 >40 fO/fpw/8.26   BUN/Creatinine Ratio 13 9 - 20   Sodium 139 134 - 144 mmol/L   Potassium 4.6 3.5 - 5.2 mmol/L   Chloride 104 96 - 106 mmol/L   CO2 21 20 - 29 mmol/L   Calcium  9.6 8.7 - 10.2 mg/dL   Total Protein 6.8 6.0 - 8.5 g/dL   Albumin 4.5 4.1 - 5.1 g/dL   Globulin, Total 2.3 1.5 - 4.5 g/dL   Bilirubin Total 0.3 0.0 - 1.2 mg/dL   Alkaline Phosphatase 67 44 - 121 IU/L   AST 16 0 - 40 IU/L   ALT 11 0 - 44 IU/L  HgB A1c   Collection Time: 04/25/23  8:38 AM  Result Value Ref Range   Hgb A1c MFr Bld 7.7 (H) 4.8 - 5.6 %   Est. average glucose Bld gHb Est-mCnc 174 mg/dL      Assessment & Plan:   Problem List Items Addressed This Visit       Endocrine   Type 2 diabetes mellitus without obesity (HCC)   Chronic.  Controlled.  Last A1c 7.7% in January.  Continue with current medication regimen of metformin  1000mg  BID, Ozempic  2mg  weekly, Farxiga  10mg  daily.   Doing well with current regimen.  Microalbumin up to date.  Eye exam requested.  Refills sent today.  Labs ordered today.  Return to clinic in 6 months for reevaluation.  Call sooner if  concerns arise.       Relevant  Medications   Semaglutide , 2 MG/DOSE, (OZEMPIC , 2 MG/DOSE,) 8 MG/3ML SOPN   Other Relevant Orders   Comprehensive metabolic panel with GFR   Hemoglobin A1c   Hyperlipidemia associated with type 2 diabetes mellitus (HCC) - Primary   Chronic.  Controlled.  Continue with current medication regimen of Atorvastatin  daily.   Labs ordered today.  Return to clinic in 6 months for reevaluation.  Call sooner if concerns arise.       Relevant Medications   Semaglutide , 2 MG/DOSE, (OZEMPIC , 2 MG/DOSE,) 8 MG/3ML SOPN   Other Relevant Orders   Lipid panel     Follow up plan: Return in about 6 months (around 04/29/2024) for Physical and Fasting labs.

## 2023-10-28 NOTE — Assessment & Plan Note (Signed)
 Chronic.  Controlled.  Continue with current medication regimen of Atorvastatin daily.  Labs ordered today.  Return to clinic in 6 months for reevaluation.  Call sooner if concerns arise.

## 2023-10-28 NOTE — Assessment & Plan Note (Signed)
 Chronic.  Controlled.  Last A1c 7.7% in January.  Continue with current medication regimen of metformin  1000mg  BID, Ozempic  2mg  weekly, Farxiga  10mg  daily.   Doing well with current regimen.  Microalbumin up to date.  Eye exam requested.  Refills sent today.  Labs ordered today.  Return to clinic in 6 months for reevaluation.  Call sooner if concerns arise.

## 2023-10-29 ENCOUNTER — Ambulatory Visit: Payer: Self-pay | Admitting: Nurse Practitioner

## 2023-10-29 LAB — COMPREHENSIVE METABOLIC PANEL WITH GFR
ALT: 13 IU/L (ref 0–44)
AST: 15 IU/L (ref 0–40)
Albumin: 4.6 g/dL (ref 4.1–5.1)
Alkaline Phosphatase: 72 IU/L (ref 44–121)
BUN/Creatinine Ratio: 11 (ref 9–20)
BUN: 12 mg/dL (ref 6–24)
Bilirubin Total: 0.5 mg/dL (ref 0.0–1.2)
CO2: 21 mmol/L (ref 20–29)
Calcium: 9.9 mg/dL (ref 8.7–10.2)
Chloride: 105 mmol/L (ref 96–106)
Creatinine, Ser: 1.06 mg/dL (ref 0.76–1.27)
Globulin, Total: 2.4 g/dL (ref 1.5–4.5)
Glucose: 134 mg/dL — ABNORMAL HIGH (ref 70–99)
Potassium: 5 mmol/L (ref 3.5–5.2)
Sodium: 140 mmol/L (ref 134–144)
Total Protein: 7 g/dL (ref 6.0–8.5)
eGFR: 86 mL/min/1.73 (ref >59)

## 2023-10-29 LAB — LIPID PANEL
Chol/HDL Ratio: 2.9 ratio (ref 0.0–5.0)
Cholesterol, Total: 150 mg/dL (ref 100–199)
HDL: 52 mg/dL (ref >39)
LDL Chol Calc (NIH): 85 mg/dL (ref 0–99)
Triglycerides: 62 mg/dL (ref 0–149)
VLDL Cholesterol Cal: 13 mg/dL (ref 5–40)

## 2023-10-29 LAB — HEMOGLOBIN A1C
Est. average glucose Bld gHb Est-mCnc: 192 mg/dL
Hgb A1c MFr Bld: 8.3 % — ABNORMAL HIGH (ref 4.8–5.6)

## 2023-10-29 MED ORDER — TIRZEPATIDE 7.5 MG/0.5ML ~~LOC~~ SOAJ: 7.5000 mg | SUBCUTANEOUS | 1 refills | Status: DC

## 2023-12-31 DIAGNOSIS — H00014 Hordeolum externum left upper eyelid: Secondary | ICD-10-CM | POA: Diagnosis not present

## 2024-04-17 ENCOUNTER — Other Ambulatory Visit: Payer: Self-pay | Admitting: Nurse Practitioner

## 2024-04-20 NOTE — Telephone Encounter (Signed)
 I did reach out to the patient, and I was unable to speak with him. If he calls back in it's okay for E2C2 to speak with him and ask if he has enough of the medication to last him until his next visit on 1/20.

## 2024-04-20 NOTE — Telephone Encounter (Deleted)
 I did reach out to the patient, and I was unable to speak with him. If he calls back in it's okay for E2C2 to speak with him and ask if he has enough of the medication to last him until his next visit on 1/20.

## 2024-04-20 NOTE — Telephone Encounter (Signed)
 Copied from CRM #[id number]. Topic: Clinical - Medication Question >> Apr 20, 2024  9:44 AM Treva T wrote: Reason for CRM: Pt returning call to office in regards to medication question on Mounjaro .  Per chart review: I did reach out to the patient, and I was unable to speak with him. If he calls back in it's okay for E2C2 to speak with him and ask if he has enough of the medication to last him until his next visit on 1/20.  Pt advised of above, states he does Not have enough medication until next appt, on 05/05/24, and took last dose on Sunday 04/19/24.  Preferred pharmacy:  [id number] - [place], [place] - [address] [place] [address] Phone: [phone] Fax: [phone]   Pt  CB#: [phone]

## 2024-04-25 ENCOUNTER — Other Ambulatory Visit: Payer: Self-pay | Admitting: Nurse Practitioner

## 2024-05-05 ENCOUNTER — Encounter: Payer: Self-pay | Admitting: Nurse Practitioner

## 2024-05-05 ENCOUNTER — Ambulatory Visit: Admitting: Nurse Practitioner

## 2024-05-05 VITALS — BP 123/76 | HR 98 | Temp 97.9°F | Ht 67.32 in | Wt 162.0 lb

## 2024-05-05 DIAGNOSIS — E785 Hyperlipidemia, unspecified: Secondary | ICD-10-CM | POA: Diagnosis not present

## 2024-05-05 DIAGNOSIS — E1169 Type 2 diabetes mellitus with other specified complication: Secondary | ICD-10-CM | POA: Diagnosis not present

## 2024-05-05 DIAGNOSIS — E119 Type 2 diabetes mellitus without complications: Secondary | ICD-10-CM

## 2024-05-05 DIAGNOSIS — Z1211 Encounter for screening for malignant neoplasm of colon: Secondary | ICD-10-CM

## 2024-05-05 DIAGNOSIS — Z0000 Encounter for general adult medical examination without abnormal findings: Secondary | ICD-10-CM

## 2024-05-05 LAB — MICROALBUMIN, URINE WAIVED
Creatinine, Urine Waived: 200 mg/dL (ref 10–300)
Microalb, Ur Waived: 30 mg/L — ABNORMAL HIGH (ref 0–19)
Microalb/Creat Ratio: <30 mg/g

## 2024-05-05 MED ORDER — DAPAGLIFLOZIN PROPANEDIOL 10 MG PO TABS: 10.0000 mg | ORAL_TABLET | Freq: Every day | ORAL | 1 refills | Status: AC

## 2024-05-05 MED ORDER — TIRZEPATIDE 10 MG/0.5ML ~~LOC~~ SOAJ: 10.0000 mg | SUBCUTANEOUS | 1 refills | Status: AC

## 2024-05-05 MED ORDER — METFORMIN HCL 1000 MG PO TABS: 1000.0000 mg | ORAL_TABLET | Freq: Two times a day (BID) | ORAL | 1 refills | Status: AC

## 2024-05-05 MED ORDER — SILDENAFIL CITRATE 50 MG PO TABS: 50.0000 mg | ORAL_TABLET | ORAL | 3 refills | Status: AC | PRN

## 2024-05-05 MED ORDER — ATORVASTATIN CALCIUM 20 MG PO TABS: 20.0000 mg | ORAL_TABLET | Freq: Every day | ORAL | 1 refills | Status: AC

## 2024-05-05 NOTE — Progress Notes (Signed)
 "  BP 123/76 (BP Location: Left Arm, Patient Position: Sitting, Cuff Size: Normal)   Pulse 98   Temp 97.9 F (36.6 C) (Oral)   Ht 5' 7.32 (1.71 m)   Wt 162 lb (73.5 kg)   SpO2 98%   BMI 25.13 kg/m    Subjective:    Patient ID: Aaron Wood Aaron Wood, male    DOB: December 27, 1975, 49 y.o.   MRN: [id number]  HPI: [name] is a 49 y.o. male presenting on 05/05/2024 for comprehensive medical examination. Current medical complaints include:none  He currently lives with: Interim Problems from his last visit: no  DIABETES Last A1c was 8.3%.  He is doing well with mounjaro .  Feels like after a couple days he gets nauseous.   Hypoglycemic episodes:no Polydipsia/polyuria: no Visual disturbance: no Chest pain: no Paresthesias: no Glucose Monitoring: no  Accucheck frequency: Not Checking  Fasting glucose:  Post prandial:  Evening:  Before meals: Taking Insulin?: no  Long acting insulin:  Short acting insulin: Blood Pressure Monitoring: not checking Retinal Examination: Done at [employer] Foot Exam: Done Today Diabetic Education: Not Completed Pneumovax: Refused Influenza: Given today Aspirin: no  HYPERLIPIDEMIA Hyperlipidemia status: excellent compliance Satisfied with current treatment?  no Side effects:  no Medication compliance: excellent compliance Past cholesterol meds: atorvastain (lipitor) Supplements: none Aspirin:  no The 10-year ASCVD risk score (Arnett DK, et al., 2019) is: 3.5%   Values used to calculate the score:     Age: 14 years     Clinically relevant sex: Male     Is Non-Hispanic African American: No     Diabetic: Yes     Tobacco smoker: No     Systolic Blood Pressure: 123 mmHg     Is BP treated: No     HDL Cholesterol: 52 mg/dL     Total Cholesterol: 150 mg/dL Chest pain:  no Coronary artery disease:  no Family history CAD:  no Family history early CAD:  no  Depression Screen done today and results listed below:     05/05/2024    8:13 AM  10/28/2023    8:25 AM 04/25/2023    8:23 AM 01/24/2023   10:00 AM 10/24/2022    9:05 AM  Depression screen PHQ 2/9  Decreased Interest 0 0 0 0 0  Down, Depressed, Hopeless 0 0 0 0 0  PHQ - 2 Score 0 0 0 0 0  Altered sleeping 0 0 0 0 0  Tired, decreased energy 1 1 0 0 0  Change in appetite 0 0 0 0 0  Feeling bad or failure about yourself  0 0 0 0 0  Trouble concentrating 0 0 0 0 0  Moving slowly or fidgety/restless 0 0 0 0 0  Suicidal thoughts 0 0 0 0 0  PHQ-9 Score 1 1  0  0  0   Difficult doing work/chores Not difficult at all Not difficult at all  Not difficult at all Not difficult at all     Data saved with a previous flowsheet row definition    The patient does not have a history of falls. I did complete a risk assessment for falls. A plan of care for falls was documented.   Past Medical History:  Past Medical History:  Diagnosis Date   Diabetes mellitus without complication (HCC)     Surgical History:  History reviewed. No pertinent surgical history.  Medications:  No current outpatient medications on file prior to visit.   No current  facility-administered medications on file prior to visit.    Allergies:  No Known Allergies  Social History:  Social History   Socioeconomic History   Marital status: Married    Spouse name: Not on file   Number of children: Not on file   Years of education: Not on file   Highest education level: 12th grade  Occupational History   Not on file  Tobacco Use   Smoking status: Never   Smokeless tobacco: Never  Vaping Use   Vaping status: Never Used  Substance and Sexual Activity   Alcohol use: Yes    Comment: occasionally   Drug use: Never   Sexual activity: Yes  Other Topics Concern   Not on file  Social History Narrative   Not on file   Social Drivers of Health   Tobacco Use: Low Risk (05/05/2024)   Patient History    Smoking Tobacco Use: Never    Smokeless Tobacco Use: Never    Passive Exposure: Not on file   Financial Resource Strain: Low Risk (07/17/2022)   Overall Financial Resource Strain (CARDIA)    Difficulty of Paying Living Expenses: Not hard at all  Food Insecurity: No Food Insecurity (07/17/2022)   Hunger Vital Sign    Worried About Running Out of Food in the Last Year: Never true    Ran Out of Food in the Last Year: Never true  Transportation Needs: No Transportation Needs (07/17/2022)   PRAPARE - Administrator, Civil Service (Medical): No    Lack of Transportation (Non-Medical): No  Physical Activity: Insufficiently Active (07/17/2022)   Exercise Vital Sign    Days of Exercise per Week: 4 days    Minutes of Exercise per Session: 30 min  Stress: No Stress Concern Present (07/17/2022)   [employer] of Occupational Health - Occupational Stress Questionnaire    Feeling of Stress : Not at all  Social Connections: Socially Integrated (07/17/2022)   Social Connection and Isolation Panel    Frequency of Communication with Friends and Family: Three times a week    Frequency of Social Gatherings with Friends and Family: Twice a week    Attends Religious Services: More than 4 times per year    Active Member of Clubs or Organizations: Yes    Attends Engineer, Structural: More than 4 times per year    Marital Status: Married  Catering Manager Violence: Not on file  Depression (PHQ2-9): Low Risk (05/05/2024)   Depression (PHQ2-9)    PHQ-2 Score: 1  Alcohol Screen: Low Risk (07/17/2022)   Alcohol Screen    Last Alcohol Screening Score (AUDIT): 2  Housing: Low Risk (07/17/2022)   Housing    Last Housing Risk Score: 0  Utilities: Not on file  Health Literacy: Not on file   Social History   Tobacco Use  Smoking Status Never  Smokeless Tobacco Never   Social History   Substance and Sexual Activity  Alcohol Use Yes   Comment: occasionally    Family History:  History reviewed. No pertinent family history.  Past medical history, surgical history, medications,  allergies, family history and social history reviewed with patient today and changes made to appropriate areas of the chart.   Review of Systems  HENT:         Denies vision changes.  Eyes:  Negative for blurred vision and double vision.  Respiratory:  Negative for shortness of breath.   Cardiovascular:  Negative for chest pain, palpitations and leg swelling.  Skin:        Discoloration of toenails.  Neurological:  Negative for dizziness, tingling and headaches.  Endo/Heme/Allergies:  Negative for polydipsia.       Denies Polyuria   All other ROS negative except what is listed above and in the HPI.      Objective:    BP 123/76 (BP Location: Left Arm, Patient Position: Sitting, Cuff Size: Normal)   Pulse 98   Temp 97.9 F (36.6 C) (Oral)   Ht 5' 7.32 (1.71 m)   Wt 162 lb (73.5 kg)   SpO2 98%   BMI 25.13 kg/m   Wt Readings from Last 3 Encounters:  05/05/24 162 lb (73.5 kg)  10/28/23 158 lb 12.8 oz (72 kg)  04/25/23 161 lb 12.8 oz (73.4 kg)    Physical Exam Vitals and nursing note reviewed.  Constitutional:      General: He is not in acute distress.    Appearance: Normal appearance. He is not ill-appearing, toxic-appearing or diaphoretic.  HENT:     Head: Normocephalic.     Right Ear: Tympanic membrane, ear canal and external ear normal.     Left Ear: Tympanic membrane, ear canal and external ear normal.     Nose: Nose normal. No congestion or rhinorrhea.     Mouth/Throat:     Mouth: Mucous membranes are moist.  Eyes:     General:        Right eye: No discharge.        Left eye: No discharge.     Extraocular Movements: Extraocular movements intact.     Conjunctiva/sclera: Conjunctivae normal.     Pupils: Pupils are equal, round, and reactive to light.  Cardiovascular:     Rate and Rhythm: Normal rate and regular rhythm.     Heart sounds: No murmur heard. Pulmonary:     Effort: Pulmonary effort is normal. No respiratory distress.     Breath sounds: Normal breath  sounds. No wheezing, rhonchi or rales.  Abdominal:     General: Abdomen is flat. Bowel sounds are normal. There is no distension.     Palpations: Abdomen is soft.     Tenderness: There is no abdominal tenderness. There is no guarding.  Musculoskeletal:     Cervical back: Normal range of motion and neck supple.  Skin:    General: Skin is warm and dry.     Capillary Refill: Capillary refill takes less than 2 seconds.  Neurological:     General: No focal deficit present.     Mental Status: He is alert and oriented to person, place, and time.     Cranial Nerves: No cranial nerve deficit.     Motor: No weakness.     Deep Tendon Reflexes: Reflexes normal.  Psychiatric:        Mood and Affect: Mood normal.        Behavior: Behavior normal.        Thought Content: Thought content normal.        Judgment: Judgment normal.     Results for orders placed or performed in visit on 10/28/23  HM DIABETES EYE EXAM   Collection Time: 06/06/23  2:02 PM  Result Value Ref Range   HM Diabetic Eye Exam        Assessment & Plan:   Problem List Items Addressed This Visit       Endocrine   Type 2 diabetes mellitus without obesity (HCC)   Chronic.  Controlled.  Last A1c  8.3%.    Continue with current medication regimen of metformin  1000mg  BID, Mounjaro  7.5mg , Farxiga  10mg  daily.   Will increase dose to Mounjaro  10mg .  Doing well with current regimen.  Microalbumin up to date.  Eye exam requested.  Refills sent today.  Labs ordered today.  Return to clinic in 3 months for reevaluation.  Call sooner if concerns arise.       Relevant Medications   atorvastatin  (LIPITOR) 20 MG tablet   dapagliflozin  propanediol (FARXIGA ) 10 MG TABS tablet   metFORMIN  (GLUCOPHAGE ) 1000 MG tablet   tirzepatide  (MOUNJARO ) 10 MG/0.5ML Pen   Other Relevant Orders   Hemoglobin A1c   Microalbumin, Urine Waived   Hyperlipidemia associated with type 2 diabetes mellitus (HCC)   Chronic.  Controlled.  Continue with current  medication regimen of Atorvastatin  daily.   Labs ordered today.  Return to clinic in 6 months for reevaluation.  Call sooner if concerns arise.       Relevant Medications   atorvastatin  (LIPITOR) 20 MG tablet   dapagliflozin  propanediol (FARXIGA ) 10 MG TABS tablet   metFORMIN  (GLUCOPHAGE ) 1000 MG tablet   sildenafil  (VIAGRA ) 50 MG tablet   tirzepatide  (MOUNJARO ) 10 MG/0.5ML Pen   Other Relevant Orders   Lipid panel   Other Visit Diagnoses       Annual physical exam    -  Primary   Health maintenance reviewed during visit today.  Labs ordered.  Vaccines reviewed.  Cologuard ordered.   Relevant Orders   TSH   PSA   Lipid panel   CBC with Differential/Platelet   Comprehensive metabolic panel with GFR   Hemoglobin A1c   Microalbumin, Urine Waived     Screening for colon cancer       Relevant Orders   Cologuard        Discussed aspirin prophylaxis for myocardial infarction prevention and decision was it was not indicated  LABORATORY TESTING:  Health maintenance labs ordered today as discussed above.     IMMUNIZATIONS:   - Tdap: Tetanus vaccination status reviewed: last tetanus booster within 10 years. - Influenza: Refused - Pneumovax: Refused - Prevnar: Not applicable - COVID: Up to date - HPV: Not applicable - Shingrix vaccine: Not applicable  SCREENING: - Colonoscopy: Ordered today Discussed with patient purpose of the colonoscopy is to detect colon cancer at curable precancerous or early stages   - AAA Screening: Not applicable  -Hearing Test: Not applicable  -Spirometry: Not applicable   PATIENT COUNSELING:    Sexuality: Discussed sexually transmitted diseases, partner selection, use of condoms, avoidance of unintended pregnancy  and contraceptive alternatives.   Advised to avoid cigarette smoking.  I discussed with the patient that most people either abstain from alcohol or drink within safe limits (<=14/week and <=4 drinks/occasion for males, <=7/weeks  and <= 3 drinks/occasion for females) and that the risk for alcohol disorders and other health effects rises proportionally with the number of drinks per week and how often a drinker exceeds daily limits.  Discussed cessation/primary prevention of drug use and availability of treatment for abuse.   Diet: Encouraged to adjust caloric intake to maintain  or achieve ideal body weight, to reduce intake of dietary saturated fat and total fat, to limit sodium intake by avoiding high sodium foods and not adding table salt, and to maintain adequate dietary potassium and calcium  preferably from fresh fruits, vegetables, and low-fat dairy products.    stressed the importance of regular exercise  Injury prevention: Discussed safety belts, safety  helmets, smoke detector, smoking near bedding or upholstery.   Dental health: Discussed importance of regular tooth brushing, flossing, and dental visits.   Follow up plan: NEXT PREVENTATIVE PHYSICAL DUE IN 1 YEAR. Return in about 3 months (around 08/03/2024) for HTN, HLD, DM2 FU.  "

## 2024-05-05 NOTE — Assessment & Plan Note (Signed)
 Chronic.  Controlled.  Continue with current medication regimen of Atorvastatin daily.  Labs ordered today.  Return to clinic in 6 months for reevaluation.  Call sooner if concerns arise.

## 2024-05-05 NOTE — Assessment & Plan Note (Signed)
 Chronic.  Controlled.  Last A1c 8.3%.    Continue with current medication regimen of metformin  1000mg  BID, Mounjaro  7.5mg , Farxiga  10mg  daily.   Will increase dose to Mounjaro  10mg .  Doing well with current regimen.  Microalbumin up to date.  Eye exam requested.  Refills sent today.  Labs ordered today.  Return to clinic in 3 months for reevaluation.  Call sooner if concerns arise.

## 2024-05-06 ENCOUNTER — Encounter: Payer: Self-pay | Admitting: Nurse Practitioner

## 2024-05-06 ENCOUNTER — Ambulatory Visit: Payer: Self-pay | Admitting: Nurse Practitioner

## 2024-05-06 LAB — CBC WITH DIFFERENTIAL/PLATELET
Basophils Absolute: 0 x10E3/uL (ref 0.0–0.2)
Basos: 1 %
EOS (ABSOLUTE): 0.3 x10E3/uL (ref 0.0–0.4)
Eos: 5 %
Hematocrit: 45.9 % (ref 37.5–51.0)
Hemoglobin: 14.8 g/dL (ref 13.0–17.7)
Immature Grans (Abs): 0 x10E3/uL (ref 0.0–0.1)
Immature Granulocytes: 0 %
Lymphocytes Absolute: 1.5 x10E3/uL (ref 0.7–3.1)
Lymphs: 25 %
MCH: 29.1 pg (ref 26.6–33.0)
MCHC: 32.2 g/dL (ref 31.5–35.7)
MCV: 90 fL (ref 79–97)
Monocytes Absolute: 0.5 x10E3/uL (ref 0.1–0.9)
Monocytes: 8 %
Neutrophils Absolute: 3.8 x10E3/uL (ref 1.4–7.0)
Neutrophils: 61 %
Platelets: 301 x10E3/uL (ref 150–450)
RBC: 5.08 x10E6/uL (ref 4.14–5.80)
RDW: 12.9 % (ref 11.6–15.4)
WBC: 6.1 x10E3/uL (ref 3.4–10.8)

## 2024-05-06 LAB — COMPREHENSIVE METABOLIC PANEL WITH GFR
ALT: 24 IU/L (ref 0–44)
AST: 25 IU/L (ref 0–40)
Albumin: 4.5 g/dL (ref 4.1–5.1)
Alkaline Phosphatase: 62 IU/L (ref 47–123)
BUN/Creatinine Ratio: 13 (ref 9–20)
BUN: 13 mg/dL (ref 6–24)
Bilirubin Total: 0.4 mg/dL (ref 0.0–1.2)
CO2: 21 mmol/L (ref 20–29)
Calcium: 9.4 mg/dL (ref 8.7–10.2)
Chloride: 101 mmol/L (ref 96–106)
Creatinine, Ser: 1.04 mg/dL (ref 0.76–1.27)
Globulin, Total: 2.3 g/dL (ref 1.5–4.5)
Glucose: 114 mg/dL — ABNORMAL HIGH (ref 70–99)
Potassium: 4.4 mmol/L (ref 3.5–5.2)
Sodium: 136 mmol/L (ref 134–144)
Total Protein: 6.8 g/dL (ref 6.0–8.5)
eGFR: 88 mL/min/1.73

## 2024-05-06 LAB — LIPID PANEL
Chol/HDL Ratio: 3.1 ratio (ref 0.0–5.0)
Cholesterol, Total: 137 mg/dL (ref 100–199)
HDL: 44 mg/dL
LDL Chol Calc (NIH): 72 mg/dL (ref 0–99)
Triglycerides: 117 mg/dL (ref 0–149)
VLDL Cholesterol Cal: 21 mg/dL (ref 5–40)

## 2024-05-06 LAB — HEMOGLOBIN A1C
Est. average glucose Bld gHb Est-mCnc: 163 mg/dL
Hgb A1c MFr Bld: 7.3 % — ABNORMAL HIGH (ref 4.8–5.6)

## 2024-05-06 LAB — PSA: Prostate Specific Ag, Serum: 0.6 ng/mL (ref 0.0–4.0)

## 2024-05-06 LAB — TSH: TSH: 1.34 u[IU]/mL (ref 0.450–4.500)

## 2024-08-05 ENCOUNTER — Ambulatory Visit: Admitting: Nurse Practitioner
